# Patient Record
Sex: Female | Born: 1945 | Race: White | Hispanic: No | Marital: Married | Smoking: Never smoker
Health system: Southern US, Community
[De-identification: ages and names within clinical notes are randomized; demographics above are authoritative.]

## PROBLEM LIST (undated history)

## (undated) DIAGNOSIS — L570 Actinic keratosis: Secondary | ICD-10-CM

## (undated) DIAGNOSIS — B029 Zoster without complications: Secondary | ICD-10-CM

## (undated) HISTORY — DX: Zoster without complications: B02.9

## (undated) HISTORY — DX: Actinic keratosis: L57.0

---

## 2004-02-20 ENCOUNTER — Ambulatory Visit: Payer: Self-pay | Admitting: Internal Medicine

## 2005-10-28 ENCOUNTER — Ambulatory Visit: Payer: Self-pay | Admitting: Internal Medicine

## 2005-11-18 ENCOUNTER — Ambulatory Visit: Payer: Self-pay | Admitting: Gastroenterology

## 2006-12-16 ENCOUNTER — Ambulatory Visit: Payer: Self-pay | Admitting: Internal Medicine

## 2008-03-09 ENCOUNTER — Ambulatory Visit: Payer: Self-pay | Admitting: Internal Medicine

## 2008-05-12 LAB — HM COLONOSCOPY

## 2009-03-11 ENCOUNTER — Emergency Department: Payer: Self-pay | Admitting: Unknown Physician Specialty

## 2009-04-23 ENCOUNTER — Ambulatory Visit: Payer: Self-pay | Admitting: Internal Medicine

## 2009-06-06 ENCOUNTER — Ambulatory Visit: Payer: Self-pay | Admitting: Internal Medicine

## 2010-05-12 ENCOUNTER — Emergency Department: Payer: Self-pay | Admitting: Internal Medicine

## 2010-08-28 ENCOUNTER — Ambulatory Visit: Payer: Self-pay | Admitting: Internal Medicine

## 2011-07-30 ENCOUNTER — Encounter: Payer: Self-pay | Admitting: Internal Medicine

## 2011-07-30 ENCOUNTER — Ambulatory Visit (INDEPENDENT_AMBULATORY_CARE_PROVIDER_SITE_OTHER): Payer: Medicare Other | Admitting: Internal Medicine

## 2011-07-30 ENCOUNTER — Other Ambulatory Visit (HOSPITAL_COMMUNITY)
Admission: RE | Admit: 2011-07-30 | Discharge: 2011-07-30 | Disposition: A | Discharge status: Home / Self Care | Payer: Medicare Other | Source: Ambulatory Visit | Attending: Internal Medicine | Admitting: Internal Medicine

## 2011-07-30 DIAGNOSIS — Z124 Encounter for screening for malignant neoplasm of cervix: Secondary | ICD-10-CM | POA: Insufficient documentation

## 2011-07-30 DIAGNOSIS — Z01419 Encounter for gynecological examination (general) (routine) without abnormal findings: Secondary | ICD-10-CM | POA: Insufficient documentation

## 2011-07-30 DIAGNOSIS — Z1239 Encounter for other screening for malignant neoplasm of breast: Secondary | ICD-10-CM | POA: Insufficient documentation

## 2011-07-30 DIAGNOSIS — E785 Hyperlipidemia, unspecified: Secondary | ICD-10-CM

## 2011-07-30 DIAGNOSIS — Z23 Encounter for immunization: Secondary | ICD-10-CM

## 2011-07-30 DIAGNOSIS — Z0000 Encounter for general adult medical examination without abnormal findings: Secondary | ICD-10-CM

## 2011-07-30 LAB — LIPID PANEL
Cholesterol: 230 mg/dL — ABNORMAL HIGH (ref 0–200)
Total CHOL/HDL Ratio: 5
Triglycerides: 89 mg/dL (ref 0.0–149.0)

## 2011-07-30 LAB — COMPREHENSIVE METABOLIC PANEL
ALT: 18 U/L (ref 0–35)
BUN: 23 mg/dL (ref 6–23)
CO2: 28 mEq/L (ref 19–32)
Calcium: 9.3 mg/dL (ref 8.4–10.5)
Creatinine, Ser: 0.8 mg/dL (ref 0.4–1.2)
GFR: 76.19 mL/min (ref >60.00)
Total Bilirubin: 0.5 mg/dL (ref 0.3–1.2)

## 2011-07-30 LAB — LDL CHOLESTEROL, DIRECT: Direct LDL: 170.5 mg/dL

## 2011-07-30 LAB — HM PAP SMEAR: HM Pap smear: NORMAL

## 2011-07-30 MED ORDER — ZOSTER VACCINE LIVE 19400 UNT/0.65ML ~~LOC~~ SOLR: 0.6500 mL | Freq: Once | SUBCUTANEOUS | Status: AC

## 2011-07-30 NOTE — Assessment & Plan Note (Signed)
General exam normal today. Pt is UTD on health maintenance except for PAP which was sent today and mammogram which was ordered.  Will also check CMP, lipids with labs. Encouraged continued healthy diet and exercise. Follow up 1 year.

## 2011-07-30 NOTE — Progress Notes (Signed)
Subjective:    Patient ID: Jessica Frank, female    DOB: July 07, 1945, 66 y.o.   MRN: [id number]  HPI 66 year old female presents for annual physical exam. She reports that she is doing well. She denies any new complaints or concerns today. She continues to follow a very healthy lifestyle, exercising on a regular basis and following a healthy diet. She recently retired from her job at [employer] and now spends her time both in [place] and [place].  Outpatient Encounter Prescriptions as of 07/30/2011  Medication Sig Dispense Refill  . fluorouracil (EFUDEX) 5 % cream Apply topically 2 (two) times daily. X 2 wks      . Multiple Vitamins-Minerals (MULTIVITAMIN WITH MINERALS) tablet Take 1 tablet by mouth daily.      . Probiotic Product (PROBIOTIC & ACIDOPHILUS EX ST) CAPS Take by mouth daily.      . vitamin E 100 UNIT capsule Take 100 Units by mouth daily.      Marland Kitchen zoster vaccine live, PF, (ZOSTAVAX) 10272 UNT/0.65ML injection Inject 19,400 Units into the skin once.  1 each  0    Review of Systems  Constitutional: Negative for fever, chills, appetite change, fatigue and unexpected weight change.  HENT: Negative for ear pain, congestion, sore throat, trouble swallowing, neck pain, voice change and sinus pressure.   Eyes: Negative for visual disturbance.  Respiratory: Negative for cough, shortness of breath, wheezing and stridor.   Cardiovascular: Negative for chest pain, palpitations and leg swelling.  Gastrointestinal: Negative for nausea, vomiting, abdominal pain, diarrhea, constipation, blood in stool, abdominal distention and anal bleeding.  Genitourinary: Negative for dysuria and flank pain.  Musculoskeletal: Negative for myalgias, arthralgias and gait problem.  Skin: Negative for color change and rash.  Neurological: Negative for dizziness and headaches.  Hematological: Negative for adenopathy. Does not bruise/bleed easily.  Psychiatric/Behavioral: Negative for suicidal  ideas, sleep disturbance and dysphoric mood. The patient is not nervous/anxious.    BP 142/70  Pulse 82  Temp(Src) 97.9 F (36.6 C) (Oral)  Ht 5\' 5"  (1.651 m)  Wt 143 lb (64.864 kg)  BMI 23.80 kg/m2  SpO2 99%     Objective:   Physical Exam  Constitutional: She is oriented to person, place, and time. She appears well-developed and well-nourished. No distress.  HENT:  Head: Normocephalic and atraumatic.  Right Ear: External ear normal.  Left Ear: External ear normal.  Nose: Nose normal.  Mouth/Throat: Oropharynx is clear and moist. No oropharyngeal exudate.  Eyes: Conjunctivae are normal. Pupils are equal, round, and reactive to light. Right eye exhibits no discharge. Left eye exhibits no discharge. No scleral icterus.  Neck: Normal range of motion. Neck supple. No tracheal deviation present. No thyromegaly present.  Cardiovascular: Normal rate, regular rhythm, normal heart sounds and intact distal pulses.  Exam reveals no gallop and no friction rub.   No murmur heard. Pulmonary/Chest: Effort normal and breath sounds normal. No respiratory distress. She has no wheezes. She has no rales. She exhibits no tenderness.  Abdominal: Soft. Bowel sounds are normal. She exhibits no distension and no mass. There is no tenderness. There is no rebound and no guarding.  Genitourinary: Rectum normal, vagina normal and uterus normal. No breast swelling, tenderness, discharge or bleeding. Pelvic exam was performed with patient prone. There is no rash, tenderness or lesion on the right labia. There is no rash, tenderness or lesion on the left labia. Uterus is not enlarged and not tender. Cervix exhibits no motion  tenderness, no discharge and no friability. Right adnexum displays no mass, no tenderness and no fullness. Left adnexum displays no mass, no tenderness and no fullness. No erythema or tenderness around the vagina. No vaginal discharge found.  Musculoskeletal: Normal range of motion. She exhibits no  edema and no tenderness.  Lymphadenopathy:    She has no cervical adenopathy.  Neurological: She is alert and oriented to person, place, and time. No cranial nerve deficit. She exhibits normal muscle tone. Coordination normal.  Skin: Skin is warm and dry. No rash noted. She is not diaphoretic. No erythema. No pallor.  Psychiatric: She has a normal mood and affect. Her behavior is normal. Judgment and thought content normal.          Assessment & Plan:

## 2011-07-30 NOTE — Assessment & Plan Note (Signed)
Will check lipids with labwork today.

## 2011-07-30 NOTE — Assessment & Plan Note (Signed)
Pelvic exam normal today. PAP pending. 

## 2011-07-30 NOTE — Assessment & Plan Note (Signed)
Breast exam normal today. Will set up mammogram.

## 2011-07-31 ENCOUNTER — Other Ambulatory Visit: Payer: Self-pay | Admitting: *Deleted

## 2011-07-31 MED ORDER — ATORVASTATIN CALCIUM 20 MG PO TABS: 20.0000 mg | ORAL_TABLET | Freq: Every day | ORAL | Status: DC

## 2011-08-01 ENCOUNTER — Encounter: Payer: Self-pay | Admitting: *Deleted

## 2011-08-26 ENCOUNTER — Telehealth: Payer: Self-pay | Admitting: Internal Medicine

## 2011-08-26 ENCOUNTER — Encounter: Payer: Self-pay | Admitting: Internal Medicine

## 2011-08-26 MED ORDER — ATORVASTATIN CALCIUM 20 MG PO TABS: 20.0000 mg | ORAL_TABLET | Freq: Every day | ORAL | Status: DC

## 2011-08-26 NOTE — Telephone Encounter (Signed)
Pt needs lab apt (LFTs & Lipids) and I will refill med. Please help set this apt up, THANKS!

## 2011-08-26 NOTE — Telephone Encounter (Signed)
Pt only has one more refill for her chol meds does she need a lab before next refill Should she refill for another month and will you write another rx for her [employer] [address]

## 2011-08-29 NOTE — Telephone Encounter (Signed)
LEFT MESSAGE FOR PT TO CALL.

## 2011-09-05 NOTE — Telephone Encounter (Signed)
Appointment for 5/8 pt aware

## 2011-09-17 ENCOUNTER — Other Ambulatory Visit (INDEPENDENT_AMBULATORY_CARE_PROVIDER_SITE_OTHER): Payer: Medicare Other | Admitting: *Deleted

## 2011-09-17 DIAGNOSIS — E785 Hyperlipidemia, unspecified: Secondary | ICD-10-CM

## 2011-09-17 LAB — LIPID PANEL: Total CHOL/HDL Ratio: 3

## 2011-09-17 LAB — HEPATIC FUNCTION PANEL
ALT: 17 U/L (ref 0–35)
AST: 25 U/L (ref 0–37)
Bilirubin, Direct: 0.1 mg/dL (ref 0.0–0.3)
Total Bilirubin: 0.5 mg/dL (ref 0.3–1.2)

## 2011-09-23 ENCOUNTER — Other Ambulatory Visit: Payer: Self-pay | Admitting: Internal Medicine

## 2011-09-30 ENCOUNTER — Ambulatory Visit: Payer: Self-pay | Admitting: Internal Medicine

## 2011-10-08 ENCOUNTER — Encounter: Payer: Self-pay | Admitting: Internal Medicine

## 2011-10-08 LAB — HM MAMMOGRAPHY: HM Mammogram: NORMAL

## 2011-11-23 ENCOUNTER — Other Ambulatory Visit: Payer: Self-pay | Admitting: Internal Medicine

## 2011-12-16 ENCOUNTER — Encounter: Payer: Self-pay | Admitting: Internal Medicine

## 2012-09-10 ENCOUNTER — Ambulatory Visit (INDEPENDENT_AMBULATORY_CARE_PROVIDER_SITE_OTHER): Payer: Medicare Other | Admitting: Internal Medicine

## 2012-09-10 ENCOUNTER — Encounter: Payer: Self-pay | Admitting: Internal Medicine

## 2012-09-10 VITALS — BP 134/80 | HR 70 | Temp 98.1°F | Ht 65.0 in | Wt 144.0 lb

## 2012-09-10 DIAGNOSIS — IMO0001 Reserved for inherently not codable concepts without codable children: Secondary | ICD-10-CM

## 2012-09-10 DIAGNOSIS — E785 Hyperlipidemia, unspecified: Secondary | ICD-10-CM

## 2012-09-10 DIAGNOSIS — Z0000 Encounter for general adult medical examination without abnormal findings: Secondary | ICD-10-CM

## 2012-09-10 DIAGNOSIS — R9431 Abnormal electrocardiogram [ECG] [EKG]: Secondary | ICD-10-CM

## 2012-09-10 DIAGNOSIS — M791 Myalgia, unspecified site: Secondary | ICD-10-CM

## 2012-09-10 LAB — CBC WITH DIFFERENTIAL/PLATELET
Basophils Relative: 0.5 % (ref 0.0–3.0)
Eosinophils Relative: 2.9 % (ref 0.0–5.0)
HCT: 39.3 % (ref 36.0–46.0)
Lymphs Abs: 2.8 10*3/uL (ref 0.7–4.0)
MCV: 85.3 fl (ref 78.0–100.0)
Monocytes Absolute: 0.7 10*3/uL (ref 0.1–1.0)
Monocytes Relative: 10.5 % (ref 3.0–12.0)
Neutrophils Relative %: 46.6 % (ref 43.0–77.0)
Platelets: 314 10*3/uL (ref 150.0–400.0)
RBC: 4.61 Mil/uL (ref 3.87–5.11)
WBC: 7.1 10*3/uL (ref 4.5–10.5)

## 2012-09-10 NOTE — Assessment & Plan Note (Signed)
Previously well controlled on Atorvastatin. Given myalgia noted today, will check CK with CMP and lipids. Will try 2 week period off Atorvastatin to see if any improvement. Reviewed new cholesterol guidelines recommending Mediterranean style diet and goal physical activity 3x per week.

## 2012-09-10 NOTE — Progress Notes (Signed)
Subjective:    Patient ID: Jessica Frank, female    DOB: June 27, 1945, 67 y.o.   MRN: [id number]  HPI The patient is here for annual Medicare wellness examination and management of other chronic and acute problems.   The risk factors are reflected in the social history.  The roster of all physicians providing medical care to patient - is listed in the Snapshot section of the chart.  Activities of daily living:  The patient is 100% independent in all ADLs: dressing, toileting, feeding as well as independent mobility  Home safety : The patient has smoke detectors in the home. They wear seatbelts.  There are no firearms at home. There is no violence in the home.   There is no risks for hepatitis, STDs or HIV. There is no history of blood transfusion. They have no travel history to infectious disease endemic areas of the world.  The patient has seen their dentist in the last six month. (Dr. Smitty Cords)  They have seen their eye doctor in the last year, seen this week, new glasses. (Dr. Fransico Michael) No issues with hearing. They have deferred audiologic testing in the last year.   They do not  have excessive sun exposure. Discussed the need for sun protection: hats, long sleeves and use of sunscreen if there is significant sun exposure. (Derm - Dr. Adolphus Birchwood)  Diet: the importance of a healthy diet is discussed. They do have a healthy diet.  The benefits of regular aerobic exercise were discussed. She walks and rides bikes.  Depression screen: there are no signs or vegative symptoms of depression- irritability, change in appetite, anhedonia, sadness/tearfullness.  Cognitive assessment: the patient manages all their financial and personal affairs and is actively engaged. They could relate day,date,year and events.  HCPOA - none in place, recommended setting this up  The following portions of the patient's history were reviewed and updated as appropriate: allergies, current medications, past  family history, past medical history,  past surgical history, past social history  and problem list.  Visual acuity was not assessed per patient preference since she has regular follow up with her ophthalmologist. Hearing and body mass index were assessed and reviewed.   During the course of the visit the patient was educated and counseled about appropriate screening and preventive services including : fall prevention , diabetes screening, nutrition counseling, colorectal cancer screening, and recommended immunizations.    Patient is concerned today about some generalized myalgia and arthralgias. She questions whether this may be secondary to use of Lipitor. 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 No weakness noted. Pt has not been taking medications for pain.  Outpatient Encounter Prescriptions as of 09/10/2012  Medication Sig Dispense Refill  . fish oil-omega-3 fatty acids 1000 MG capsule Take 2 g by mouth daily.      . Multiple Vitamins-Minerals (MULTIVITAMIN WITH MINERALS) tablet Take 1 tablet by mouth daily.      . Probiotic Product (PROBIOTIC & ACIDOPHILUS EX ST) CAPS Take by mouth daily.      . [DISCONTINUED] atorvastatin (LIPITOR) 20 MG tablet TAKE 1 TABLET (20 MG TOTAL) BY MOUTH DAILY.  30 tablet  1  . atorvastatin (LIPITOR) 20 MG tablet Take 1 tablet (20 mg total) by mouth daily.  90 tablet  1  . vitamin E 100 UNIT capsule Take 100 Units by mouth daily.       No facility-administered encounter medications on file as of  09/10/2012.   BP 134/80  Pulse 70  Temp(Src) 98.1 F (36.7 C) (Oral)  Ht 5\' 5"  (1.651 m)  Wt 144 lb (65.318 kg)  BMI 23.96 kg/m2  SpO2 98%   Review of Systems  Constitutional: Negative for fever, chills, appetite change, fatigue and unexpected weight change.  HENT: Negative for ear pain, congestion, sore throat, trouble swallowing, neck pain, voice change and sinus pressure.   Eyes:  Negative for visual disturbance.  Respiratory: Negative for cough, shortness of breath, wheezing and stridor.   Cardiovascular: Negative for chest pain, palpitations and leg swelling.  Gastrointestinal: Negative for nausea, vomiting, abdominal pain, diarrhea, constipation, blood in stool, abdominal distention and anal bleeding.  Genitourinary: Negative for dysuria and flank pain.  Musculoskeletal: Positive for myalgias and arthralgias. Negative for gait problem.  Skin: Negative for color change and rash.  Neurological: Negative for dizziness and headaches.  Hematological: Negative for adenopathy. Does not bruise/bleed easily.  Psychiatric/Behavioral: Negative for suicidal ideas, sleep disturbance and dysphoric mood. The patient is not nervous/anxious.        Objective:   Physical Exam  Constitutional: She is oriented to person, place, and time. She appears well-developed and well-nourished. No distress.  HENT:  Head: Normocephalic and atraumatic.  Right Ear: External ear normal.  Left Ear: External ear normal.  Nose: Nose normal.  Mouth/Throat: Oropharynx is clear and moist. No oropharyngeal exudate.  Eyes: Conjunctivae are normal. Pupils are equal, round, and reactive to light. Right eye exhibits no discharge. Left eye exhibits no discharge. No scleral icterus.  Neck: Normal range of motion. Neck supple. No tracheal deviation present. No thyromegaly present.  Cardiovascular: Normal rate, regular rhythm, normal heart sounds and intact distal pulses.  Exam reveals no gallop and no friction rub.   No murmur heard. Pulmonary/Chest: Effort normal and breath sounds normal. No accessory muscle usage. Not tachypneic. No respiratory distress. She has no decreased breath sounds. She has no wheezes. She has no rhonchi. She has no rales. She exhibits no tenderness. Right breast exhibits no inverted nipple, no mass, no nipple discharge, no skin change and no tenderness. Left breast exhibits no inverted  nipple, no mass, no nipple discharge, no skin change and no tenderness. Breasts are symmetrical.  Abdominal: Soft. Bowel sounds are normal. She exhibits no distension and no mass. There is no tenderness. There is no rebound and no guarding.  Musculoskeletal: Normal range of motion. She exhibits no edema and no tenderness.  Lymphadenopathy:    She has no cervical adenopathy.  Neurological: She is alert and oriented to person, place, and time. No cranial nerve deficit. She exhibits normal muscle tone. Coordination normal.  Skin: Skin is warm and dry. No rash noted. She is not diaphoretic. No erythema. No pallor.  Psychiatric: She has a normal mood and affect. Her behavior is normal. Judgment and thought content normal.          Assessment & Plan:

## 2012-09-10 NOTE — Assessment & Plan Note (Signed)
General medical exam including breast exam normal today. PAP and pelvic deferred as normal PAP 07/2011. Mammogram ordered. Will check labs including CBC, CMP, lipids with labs today. Encouraged continued efforts at healthy diet and regular physical activity.

## 2012-09-10 NOTE — Assessment & Plan Note (Signed)
Screening EKG shows some abnormal findings. Pt asymptomatic and physically very active. Will send EKG to cardiology for review. Will also try to get previous records on EKG/stress test in the past.

## 2012-09-10 NOTE — Assessment & Plan Note (Signed)
Most likely related to increased physical activity lately with home renovation, however will check CK and try period off Atorvastatin to see if this may be contributing.

## 2012-09-12 ENCOUNTER — Telehealth: Payer: Self-pay | Admitting: Internal Medicine

## 2012-09-12 NOTE — Telephone Encounter (Signed)
EKG

## 2012-09-13 LAB — COMPREHENSIVE METABOLIC PANEL
ALT: 19 U/L (ref 0–35)
AST: 26 U/L (ref 0–37)
CO2: 27 mEq/L (ref 19–32)
Calcium: 9.3 mg/dL (ref 8.4–10.5)
Chloride: 103 mEq/L (ref 96–112)
GFR: 77.04 mL/min (ref >60.00)
Potassium: 3.8 mEq/L (ref 3.5–5.1)
Sodium: 137 mEq/L (ref 135–145)
Total Protein: 7.7 g/dL (ref 6.0–8.3)

## 2012-09-13 LAB — LIPID PANEL: Total CHOL/HDL Ratio: 4

## 2012-09-13 LAB — CK: Total CK: 116 U/L (ref 7–177)

## 2012-09-16 ENCOUNTER — Encounter: Payer: Self-pay | Admitting: Emergency Medicine

## 2012-09-16 ENCOUNTER — Telehealth: Payer: Self-pay

## 2012-09-16 NOTE — Telephone Encounter (Signed)
MyChart Message: I was reviewing your EKG with one of our cardiologists. There was an electrical abnormality which can be a normal finding, but I don't have an old EKG for comparison. Have you ever had an EKG in the past? Or a stress test?   Notified about the electrical abnormality which can be normal finding. Patient stated yes she had a EKG and stress test all in the past and all of that was done when she first started coming to see you. 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

## 2012-11-15 LAB — HM MAMMOGRAPHY: HM MAMMO: NORMAL

## 2012-11-16 ENCOUNTER — Ambulatory Visit: Payer: Self-pay | Admitting: Internal Medicine

## 2012-11-17 ENCOUNTER — Telehealth: Payer: Self-pay | Admitting: Internal Medicine

## 2012-11-17 NOTE — Telephone Encounter (Signed)
Mammogram normal

## 2012-11-26 ENCOUNTER — Encounter: Payer: Self-pay | Admitting: Internal Medicine

## 2012-12-15 ENCOUNTER — Other Ambulatory Visit: Payer: Self-pay

## 2013-03-17 ENCOUNTER — Other Ambulatory Visit: Payer: Self-pay

## 2013-09-15 ENCOUNTER — Ambulatory Visit (INDEPENDENT_AMBULATORY_CARE_PROVIDER_SITE_OTHER): Payer: Medicare Other | Admitting: Internal Medicine

## 2013-09-15 ENCOUNTER — Encounter: Payer: Medicare Other | Admitting: Internal Medicine

## 2013-09-15 ENCOUNTER — Encounter: Payer: Self-pay | Admitting: Internal Medicine

## 2013-09-15 VITALS — BP 160/82 | HR 75 | Temp 98.1°F | Wt 145.0 lb

## 2013-09-15 DIAGNOSIS — IMO0001 Reserved for inherently not codable concepts without codable children: Secondary | ICD-10-CM | POA: Insufficient documentation

## 2013-09-15 DIAGNOSIS — Z23 Encounter for immunization: Secondary | ICD-10-CM

## 2013-09-15 DIAGNOSIS — Z1239 Encounter for other screening for malignant neoplasm of breast: Secondary | ICD-10-CM

## 2013-09-15 DIAGNOSIS — R030 Elevated blood-pressure reading, without diagnosis of hypertension: Secondary | ICD-10-CM

## 2013-09-15 DIAGNOSIS — Z0000 Encounter for general adult medical examination without abnormal findings: Secondary | ICD-10-CM

## 2013-09-15 NOTE — Assessment & Plan Note (Signed)
BP Readings from Last 3 Encounters:  09/15/13 160/82  09/10/12 134/80  06/17/10 132/80   BP elevated today compared to previous. Will have pt monitor at home and recheck tomorrow in office. If persistently elevated >150/90, discussed adding medication.

## 2013-09-15 NOTE — Progress Notes (Signed)
Subjective:    Patient ID: Jessica FlorasPamela N Frank, female    DOB: 03/13/1946, 68 y.o.   MRN: [id number]  HPI The patient is here for annual Medicare wellness examination and management of other chronic and acute problems.   The risk factors are reflected in the social history.  The roster of all physicians providing medical care to patient - is listed in the Snapshot section of the chart.  Activities of daily living:  The patient is 100% independent in all ADLs: dressing, toileting, feeding as well as independent mobility. Lives with husband.  Home safety : The patient has smoke detectors in the home. They wear seatbelts.  There are no firearms at home. There is no violence in the home.   There is no risks for hepatitis, STDs or HIV. There is no history of blood transfusion. They have no travel history to infectious disease endemic areas of the world.  The patient has seen their dentist in the last six month. (Dr. Smitty Cordsouloupas)  They have seen their eye doctor in the last year, seen this week, new glasses. [facility] No issues with hearing. They have deferred audiologic testing in the last year.   They do not  have excessive sun exposure. Discussed the need for sun protection: hats, long sleeves and use of sunscreen if there is significant sun exposure. (Derm - Dr. Adolphus Birchwoodasher)  Diet: the importance of a healthy diet is discussed. They do have a healthy diet.  The benefits of regular aerobic exercise were discussed. She walks and rides bikes.  Depression screen: there are no signs or vegative symptoms of depression- irritability, change in appetite, anhedonia, sadness/tearfullness.  Cognitive assessment: the patient manages all their financial and personal affairs and is actively engaged. They could relate day,date,year and events.  HCPOA - none in place, recommended setting this up  The following portions of the patient's history were reviewed and updated as appropriate: allergies,  current medications, past family history, past medical history,  past surgical history, past social history  and problem list.  Visual acuity was not assessed per patient preference since she has regular follow up with her ophthalmologist. Hearing and body mass index were assessed and reviewed.   During the course of the visit the patient was educated and counseled about appropriate screening and preventive services including : fall prevention , diabetes screening, nutrition counseling, colorectal cancer screening, and recommended immunizations.      Review of Systems  Constitutional: Negative for fever, chills, appetite change, fatigue and unexpected weight change.  HENT: Negative for congestion, ear pain, sinus pressure, sore throat, trouble swallowing and voice change.   Eyes: Negative for visual disturbance.  Respiratory: Negative for cough, shortness of breath, wheezing and stridor.   Cardiovascular: Negative for chest pain, palpitations and leg swelling.  Gastrointestinal: Negative for nausea, vomiting, abdominal pain, diarrhea, constipation, blood in stool, abdominal distention and anal bleeding.  Genitourinary: Negative for dysuria and flank pain.  Musculoskeletal: Positive for arthralgias (DIP joints hands). Negative for gait problem, myalgias and neck pain.  Skin: Negative for color change and rash.  Neurological: Negative for dizziness and headaches.  Hematological: Negative for adenopathy. Does not bruise/bleed easily.  Psychiatric/Behavioral: Negative for suicidal ideas, sleep disturbance and dysphoric mood. The patient is not nervous/anxious.    BP 160/82  Pulse 75  Temp(Src) 98.1 F (36.7 C) (Oral)  Wt 145 lb (65.772 kg)  SpO2 97%     Objective:   Physical Exam  Constitutional: She is oriented  to person, place, and time. She appears well-developed and well-nourished. No distress.  HENT:  Head: Normocephalic and atraumatic.  Right Ear: External ear normal.  Left Ear:  External ear normal.  Nose: Nose normal.  Mouth/Throat: Oropharynx is clear and moist. No oropharyngeal exudate.  Eyes: Conjunctivae are normal. Pupils are equal, round, and reactive to light. Right eye exhibits no discharge. Left eye exhibits no discharge. No scleral icterus.  Neck: Normal range of motion. Neck supple. No tracheal deviation present. No thyromegaly present.  Cardiovascular: Normal rate, regular rhythm, normal heart sounds and intact distal pulses.  Exam reveals no gallop and no friction rub.   No murmur heard. Pulmonary/Chest: Effort normal and breath sounds normal. No accessory muscle usage. Not tachypneic. No respiratory distress. She has no decreased breath sounds. She has no wheezes. She has no rhonchi. She has no rales. She exhibits no tenderness. Right breast exhibits no inverted nipple, no mass, no nipple discharge, no skin change and no tenderness. Left breast exhibits no inverted nipple, no mass, no nipple discharge, no skin change and no tenderness. Breasts are symmetrical.  Abdominal: Soft. Bowel sounds are normal. She exhibits no distension and no mass. There is no tenderness. There is no rebound and no guarding.  Musculoskeletal: Normal range of motion. She exhibits no edema and no tenderness.  Lymphadenopathy:    She has no cervical adenopathy.  Neurological: She is alert and oriented to person, place, and time. No cranial nerve deficit. She exhibits normal muscle tone. Coordination normal.  Skin: Skin is warm and dry. No rash noted. She is not diaphoretic. No erythema. No pallor.  Psychiatric: She has a normal mood and affect. Her behavior is normal. Judgment and thought content normal.          Assessment & Plan:

## 2013-09-15 NOTE — Assessment & Plan Note (Signed)
General medical exam normal today including breast exam. PAP and pelvic exam deferred as PAP normal 2013, HPV neg. Colonoscopy UTD. Immunizations UTD except for Prevnar which was given today. Will check fasting labs including CBC, CMP, lipids tomorrow. Encouraged healthy diet and regular exercise.

## 2013-09-15 NOTE — Progress Notes (Signed)
Pre visit review using our clinic review tool, if applicable. No additional management support is needed unless otherwise documented below in the visit note. 

## 2013-09-16 ENCOUNTER — Other Ambulatory Visit (INDEPENDENT_AMBULATORY_CARE_PROVIDER_SITE_OTHER): Payer: Medicare Other

## 2013-09-16 DIAGNOSIS — Z0000 Encounter for general adult medical examination without abnormal findings: Secondary | ICD-10-CM

## 2013-09-16 LAB — LIPID PANEL
CHOL/HDL RATIO: 5
CHOLESTEROL: 222 mg/dL — AB (ref 0–200)
HDL: 47.2 mg/dL (ref >39.00)
LDL CALC: 156 mg/dL — AB (ref 0–99)
Triglycerides: 95 mg/dL (ref 0.0–149.0)
VLDL: 19 mg/dL (ref 0.0–40.0)

## 2013-09-16 LAB — CBC WITH DIFFERENTIAL/PLATELET
Basophils Absolute: 0 10*3/uL (ref 0.0–0.1)
Basophils Relative: 0.5 % (ref 0.0–3.0)
EOS ABS: 0.2 10*3/uL (ref 0.0–0.7)
Eosinophils Relative: 2 % (ref 0.0–5.0)
HCT: 39.5 % (ref 36.0–46.0)
HEMOGLOBIN: 13.2 g/dL (ref 12.0–15.0)
LYMPHS PCT: 29.1 % (ref 12.0–46.0)
Lymphs Abs: 2.4 10*3/uL (ref 0.7–4.0)
MCHC: 33.4 g/dL (ref 30.0–36.0)
MCV: 86.8 fl (ref 78.0–100.0)
MONOS PCT: 9.3 % (ref 3.0–12.0)
Monocytes Absolute: 0.8 10*3/uL (ref 0.1–1.0)
NEUTROS ABS: 4.9 10*3/uL (ref 1.4–7.7)
Neutrophils Relative %: 59.1 % (ref 43.0–77.0)
Platelets: 313 10*3/uL (ref 150.0–400.0)
RBC: 4.56 Mil/uL (ref 3.87–5.11)
RDW: 14.1 % (ref 11.5–15.5)
WBC: 8.3 10*3/uL (ref 4.0–10.5)

## 2013-09-16 LAB — MICROALBUMIN / CREATININE URINE RATIO
Creatinine,U: 70.4 mg/dL
MICROALB/CREAT RATIO: 0.1 mg/g (ref 0.0–30.0)
Microalb, Ur: 0.1 mg/dL (ref 0.0–1.9)

## 2013-09-16 LAB — COMPREHENSIVE METABOLIC PANEL
ALBUMIN: 4.3 g/dL (ref 3.5–5.2)
ALK PHOS: 45 U/L (ref 39–117)
ALT: 15 U/L (ref 0–35)
AST: 22 U/L (ref 0–37)
BILIRUBIN TOTAL: 0.9 mg/dL (ref 0.2–1.2)
BUN: 13 mg/dL (ref 6–23)
CO2: 30 mEq/L (ref 19–32)
CREATININE: 0.7 mg/dL (ref 0.4–1.2)
Calcium: 9.7 mg/dL (ref 8.4–10.5)
Chloride: 102 mEq/L (ref 96–112)
GFR: 82.82 mL/min (ref >60.00)
GLUCOSE: 85 mg/dL (ref 70–99)
POTASSIUM: 4.4 meq/L (ref 3.5–5.1)
Sodium: 140 mEq/L (ref 135–145)
Total Protein: 7.3 g/dL (ref 6.0–8.3)

## 2013-09-17 LAB — VITAMIN D 25 HYDROXY (VIT D DEFICIENCY, FRACTURES): Vit D, 25-Hydroxy: 39 ng/mL (ref 30–89)

## 2013-09-19 ENCOUNTER — Encounter: Payer: Self-pay | Admitting: *Deleted

## 2013-09-28 ENCOUNTER — Encounter: Payer: Self-pay | Admitting: Internal Medicine

## 2013-11-22 ENCOUNTER — Ambulatory Visit: Payer: Self-pay | Admitting: Internal Medicine

## 2013-11-22 ENCOUNTER — Encounter: Payer: Self-pay | Admitting: *Deleted

## 2013-11-22 LAB — HM MAMMOGRAPHY: HM Mammogram: NEGATIVE

## 2014-09-21 ENCOUNTER — Encounter: Payer: Self-pay | Admitting: Internal Medicine

## 2014-09-21 ENCOUNTER — Ambulatory Visit (INDEPENDENT_AMBULATORY_CARE_PROVIDER_SITE_OTHER): Payer: Medicare Other | Admitting: Internal Medicine

## 2014-09-21 VITALS — BP 137/82 | HR 74 | Temp 98.0°F | Ht 64.5 in | Wt 140.2 lb

## 2014-09-21 DIAGNOSIS — Z0000 Encounter for general adult medical examination without abnormal findings: Secondary | ICD-10-CM

## 2014-09-21 DIAGNOSIS — Z23 Encounter for immunization: Secondary | ICD-10-CM

## 2014-09-21 LAB — COMPREHENSIVE METABOLIC PANEL
ALT: 15 U/L (ref 0–35)
AST: 22 U/L (ref 0–37)
Albumin: 4.3 g/dL (ref 3.5–5.2)
Alkaline Phosphatase: 55 U/L (ref 39–117)
BUN: 21 mg/dL (ref 6–23)
CO2: 32 mEq/L (ref 19–32)
CREATININE: 0.73 mg/dL (ref 0.40–1.20)
Calcium: 9.8 mg/dL (ref 8.4–10.5)
Chloride: 100 mEq/L (ref 96–112)
GFR: 83.88 mL/min (ref >60.00)
Glucose, Bld: 85 mg/dL (ref 70–99)
Potassium: 4.6 mEq/L (ref 3.5–5.1)
Sodium: 138 mEq/L (ref 135–145)
Total Bilirubin: 0.5 mg/dL (ref 0.2–1.2)
Total Protein: 7.2 g/dL (ref 6.0–8.3)

## 2014-09-21 LAB — CBC WITH DIFFERENTIAL/PLATELET
Basophils Absolute: 0 10*3/uL (ref 0.0–0.1)
Basophils Relative: 0.5 % (ref 0.0–3.0)
EOS ABS: 0.3 10*3/uL (ref 0.0–0.7)
Eosinophils Relative: 4.4 % (ref 0.0–5.0)
HCT: 39.5 % (ref 36.0–46.0)
Hemoglobin: 13.4 g/dL (ref 12.0–15.0)
Lymphocytes Relative: 36.6 % (ref 12.0–46.0)
Lymphs Abs: 2.5 10*3/uL (ref 0.7–4.0)
MCHC: 33.9 g/dL (ref 30.0–36.0)
MCV: 84.8 fl (ref 78.0–100.0)
MONO ABS: 0.7 10*3/uL (ref 0.1–1.0)
Monocytes Relative: 10.8 % (ref 3.0–12.0)
NEUTROS PCT: 47.7 % (ref 43.0–77.0)
Neutro Abs: 3.3 10*3/uL (ref 1.4–7.7)
Platelets: 329 10*3/uL (ref 150.0–400.0)
RBC: 4.66 Mil/uL (ref 3.87–5.11)
RDW: 14.5 % (ref 11.5–15.5)
WBC: 7 10*3/uL (ref 4.0–10.5)

## 2014-09-21 LAB — LIPID PANEL
Cholesterol: 246 mg/dL — ABNORMAL HIGH (ref 0–200)
HDL: 48.1 mg/dL (ref >39.00)
LDL Cholesterol: 170 mg/dL — ABNORMAL HIGH (ref 0–99)
NonHDL: 197.9
TRIGLYCERIDES: 142 mg/dL (ref 0.0–149.0)
Total CHOL/HDL Ratio: 5
VLDL: 28.4 mg/dL (ref 0.0–40.0)

## 2014-09-21 LAB — VITAMIN D 25 HYDROXY (VIT D DEFICIENCY, FRACTURES): VITD: 32.85 ng/mL (ref 30.00–100.00)

## 2014-09-21 LAB — TSH: TSH: 5.95 u[IU]/mL — ABNORMAL HIGH (ref 0.35–4.50)

## 2014-09-21 NOTE — Progress Notes (Signed)
Subjective:    Patient ID: Jessica FlorasPamela N Kaczynski, female    DOB: [birth year], 69 y.o.   MRN: [id number]  HPI  The patient is here for annual Medicare wellness examination and management of other chronic and acute problems.   The risk factors are reflected in the social history.  The roster of all physicians providing medical care to patient - is listed in the Snapshot section of the chart.  Activities of daily living:  The patient is 100% independent in all ADLs: dressing, toileting, feeding as well as independent mobility. Lives with husband.  Home safety : The patient has smoke detectors in the home. They wear seatbelts.  There are no firearms at home. There is no violence in the home.   There is no risks for hepatitis, STDs or HIV. There is no history of blood transfusion. They have no travel history to infectious disease endemic areas of the world.  The patient has seen their dentist in the last six month. (Dr. Smitty Cordsouloupas)  They have seen their eye doctor in the last year, seen this week, new glasses. [facility]) No issues with hearing. They have deferred audiologic testing in the last year.   They do not  have excessive sun exposure. Discussed the need for sun protection: hats, long sleeves and use of sunscreen if there is significant sun exposure. (Derm - Dr. Adolphus Birchwoodasher)  Diet: the importance of a healthy diet is discussed. They do have a healthy diet.  The benefits of regular aerobic exercise were discussed. She walks and rides bikes.  Depression screen: there are no signs or vegative symptoms of depression- irritability, change in appetite, anhedonia, sadness/tearfullness.  Cognitive assessment: the patient manages all their financial and personal affairs and is actively engaged. They could relate day,date,year and events.  HCPOA - none in place, recommended setting this up  The following portions of the patient's history were reviewed and updated as appropriate:  allergies, current medications, past family history, past medical history,  past surgical history, past social history  and problem list.  Visual acuity was not assessed per patient preference since she has regular follow up with her ophthalmologist. Hearing and body mass index were assessed and reviewed.   During the course of the visit the patient was educated and counseled about appropriate screening and preventive services including : fall prevention , diabetes screening, nutrition counseling, colorectal cancer screening, and recommended immunizations.     Past medical, surgical, family and social history per today's encounter.  Review of Systems  Constitutional: Negative for fever, chills, appetite change, fatigue and unexpected weight change.  Eyes: Negative for visual disturbance.  Respiratory: Negative for shortness of breath.   Cardiovascular: Negative for chest pain and leg swelling.  Gastrointestinal: Negative for nausea, vomiting, abdominal pain, diarrhea, constipation, blood in stool and abdominal distention.  Musculoskeletal: Positive for arthralgias (hands). Negative for myalgias.  Skin: Negative for color change and rash.  Neurological: Negative for weakness.  Hematological: Negative for adenopathy. Does not bruise/bleed easily.  Psychiatric/Behavioral: Negative for sleep disturbance and dysphoric mood. The patient is not nervous/anxious.        Objective:    BP 137/82 mmHg  Pulse 74  Temp(Src) 98 F (36.7 C) (Oral)  Ht 5' 4.5" (1.638 m)  Wt 140 lb 4 oz (63.617 kg)  BMI 23.71 kg/m2  SpO2 99% Physical Exam  Constitutional: She is oriented to person, place, and time. She appears well-developed and well-nourished. No distress.  HENT:  Head: Normocephalic and  atraumatic.  Right Ear: External ear normal.  Left Ear: External ear normal.  Nose: Nose normal.  Mouth/Throat: Oropharynx is clear and moist. No oropharyngeal exudate.  Eyes: Conjunctivae are normal. Pupils  are equal, round, and reactive to light. Right eye exhibits no discharge. Left eye exhibits no discharge. No scleral icterus.  Neck: Normal range of motion. Neck supple. No tracheal deviation present. No thyromegaly present.  Cardiovascular: Normal rate, regular rhythm, normal heart sounds and intact distal pulses.  Exam reveals no gallop and no friction rub.   No murmur heard. Pulmonary/Chest: Effort normal and breath sounds normal. No accessory muscle usage. No tachypnea. No respiratory distress. She has no decreased breath sounds. She has no wheezes. She has no rales. She exhibits no tenderness. Right breast exhibits no inverted nipple, no mass, no nipple discharge, no skin change and no tenderness. Left breast exhibits no inverted nipple, no mass, no nipple discharge, no skin change and no tenderness. Breasts are symmetrical.  Abdominal: Soft. Bowel sounds are normal. She exhibits no distension and no mass. There is no tenderness. There is no rebound and no guarding.  Musculoskeletal: Normal range of motion. She exhibits no edema or tenderness.  Lymphadenopathy:    She has no cervical adenopathy.  Neurological: She is alert and oriented to person, place, and time. No cranial nerve deficit. She exhibits normal muscle tone. Coordination normal.  Skin: Skin is warm and dry. No rash noted. She is not diaphoretic. No erythema. No pallor.  Psychiatric: She has a normal mood and affect. Her behavior is normal. Judgment and thought content normal.          Assessment & Plan:  Patient was given a handout regarding current recommendations for health maintenance and preventative care on the AVS.  Problem List Items Addressed This Visit      Unprioritized   Medicare annual wellness visit, subsequent - Primary    General medical exam normal today including breast exam. PAP and pelvic exam deferred as PAP normal 2013, HPV neg. Mammogram ordered. Colonoscopy UTD. Immunizations UTD except for Pneumovax  which was given today. Will check fasting labs including CBC, CMP, lipids. Encouraged healthy diet and regular exercise.        Relevant Orders   CBC with Differential/Platelet   Comprehensive metabolic panel   Lipid panel   Vit D  25 hydroxy (rtn osteoporosis monitoring)   TSH   MM Digital Screening    Other Visit Diagnoses    Need for 23-polyvalent pneumococcal polysaccharide vaccine        Relevant Orders    Pneumococcal polysaccharide vaccine 23-valent greater than or equal to 2yo subcutaneous/IM (Completed)        Return in about 1 year (around 09/21/2015) for Wellness Visit.

## 2014-09-21 NOTE — Assessment & Plan Note (Addendum)
General medical exam normal today including breast exam. PAP and pelvic exam deferred as PAP normal 2013, HPV neg. Mammogram ordered. Colonoscopy UTD. Immunizations UTD except for Pneumovax which was given today. Will check fasting labs including CBC, CMP, lipids. Encouraged healthy diet and regular exercise.

## 2014-09-21 NOTE — Progress Notes (Signed)
Pre visit review using our clinic review tool, if applicable. No additional management support is needed unless otherwise documented below in the visit note. 

## 2014-09-21 NOTE — Patient Instructions (Addendum)
Consider trying Tumeric to help with joint pain.  Health Maintenance Adopting a healthy lifestyle and getting preventive care can go a long way to promote health and wellness. Talk with your health care provider about what schedule of regular examinations is right for you. This is a good chance for you to check in with your provider about disease prevention and staying healthy. In between checkups, there are plenty of things you can do on your own. Experts have done a lot of research about which lifestyle changes and preventive measures are most likely to keep you healthy. Ask your health care provider for more information. WEIGHT AND DIET  Eat a healthy diet  Be sure to include plenty of vegetables, fruits, low-fat dairy products, and lean protein.  Do not eat a lot of foods high in solid fats, added sugars, or salt.  Get regular exercise. This is one of the most important things you can do for your health.  Most adults should exercise for at least 150 minutes each week. The exercise should increase your heart rate and make you sweat (moderate-intensity exercise).  Most adults should also do strengthening exercises at least twice a week. This is in addition to the moderate-intensity exercise.  Maintain a healthy weight  Body mass index (BMI) is a measurement that can be used to identify possible weight problems. It estimates body fat based on height and weight. Your health care provider can help determine your BMI and help you achieve or maintain a healthy weight.  For females 14 years of age and older:   A BMI below 18.5 is considered underweight.  A BMI of 18.5 to 24.9 is normal.  A BMI of 25 to 29.9 is considered overweight.  A BMI of 30 and above is considered obese.  Watch levels of cholesterol and blood lipids  You should start having your blood tested for lipids and cholesterol at 70 years of age, then have this test every 5 years.  You may need to have your cholesterol  levels checked more often if:  Your lipid or cholesterol levels are high.  You are older than 70 years of age.  You are at high risk for heart disease.  CANCER SCREENING   Lung Cancer  Lung cancer screening is recommended for adults 67-80 years old who are at high risk for lung cancer because of a history of smoking.  A yearly low-dose CT scan of the lungs is recommended for people who:  Currently smoke.  Have quit within the past 15 years.  Have at least a 30-pack-year history of smoking. A pack year is smoking an average of one pack of cigarettes a day for 1 year.  Yearly screening should continue until it has been 15 years since you quit.  Yearly screening should stop if you develop a health problem that would prevent you from having lung cancer treatment.  Breast Cancer  Practice breast self-awareness. This means understanding how your breasts normally appear and feel.  It also means doing regular breast self-exams. Let your health care provider know about any changes, no matter how small.  If you are in your 20s or 30s, you should have a clinical breast exam (CBE) by a health care provider every 1-3 years as part of a regular health exam.  If you are 19 or older, have a CBE every year. Also consider having a breast X-ray (mammogram) every year.  If you have a family history of breast cancer, talk to your health  care provider about genetic screening.  If you are at high risk for breast cancer, talk to your health care provider about having an MRI and a mammogram every year.  Breast cancer gene (BRCA) assessment is recommended for women who have family members with BRCA-related cancers. BRCA-related cancers include:  Breast.  Ovarian.  Tubal.  Peritoneal cancers.  Results of the assessment will determine the need for genetic counseling and BRCA1 and BRCA2 testing. Cervical Cancer Routine pelvic examinations to screen for cervical cancer are no longer  recommended for nonpregnant women who are considered low risk for cancer of the pelvic organs (ovaries, uterus, and vagina) and who do not have symptoms. A pelvic examination may be necessary if you have symptoms including those associated with pelvic infections. Ask your health care provider if a screening pelvic exam is right for you.   The Pap test is the screening test for cervical cancer for women who are considered at risk.  If you had a hysterectomy for a problem that was not cancer or a condition that could lead to cancer, then you no longer need Pap tests.  If you are older than 65 years, and you have had normal Pap tests for the past 10 years, you no longer need to have Pap tests.  If you have had past treatment for cervical cancer or a condition that could lead to cancer, you need Pap tests and screening for cancer for at least 20 years after your treatment.  If you no longer get a Pap test, assess your risk factors if they change (such as having a new sexual partner). This can affect whether you should start being screened again.  Some women have medical problems that increase their chance of getting cervical cancer. If this is the case for you, your health care provider may recommend more frequent screening and Pap tests.  The human papillomavirus (HPV) test is another test that may be used for cervical cancer screening. The HPV test looks for the virus that can cause cell changes in the cervix. The cells collected during the Pap test can be tested for HPV.  The HPV test can be used to screen women 79 years of age and older. Getting tested for HPV can extend the interval between normal Pap tests from three to five years.  An HPV test also should be used to screen women of any age who have unclear Pap test results.  After 70 years of age, women should have HPV testing as often as Pap tests.  Colorectal Cancer  This type of cancer can be detected and often prevented.  Routine  colorectal cancer screening usually begins at 70 years of age and continues through 70 years of age.  Your health care provider may recommend screening at an earlier age if you have risk factors for colon cancer.  Your health care provider may also recommend using home test kits to check for hidden blood in the stool.  A small camera at the end of a tube can be used to examine your colon directly (sigmoidoscopy or colonoscopy). This is done to check for the earliest forms of colorectal cancer.  Routine screening usually begins at age 76.  Direct examination of the colon should be repeated every 5-10 years through 70 years of age. However, you may need to be screened more often if early forms of precancerous polyps or small growths are found. Skin Cancer  Check your skin from head to toe regularly.  Tell your health  care provider about any new moles or changes in moles, especially if there is a change in a mole's shape or color.  Also tell your health care provider if you have a mole that is larger than the size of a pencil eraser.  Always use sunscreen. Apply sunscreen liberally and repeatedly throughout the day.  Protect yourself by wearing long sleeves, pants, a wide-brimmed hat, and sunglasses whenever you are outside. HEART DISEASE, DIABETES, AND HIGH BLOOD PRESSURE   Have your blood pressure checked at least every 1-2 years. High blood pressure causes heart disease and increases the risk of stroke.  If you are between 68 years and 69 years old, ask your health care provider if you should take aspirin to prevent strokes.  Have regular diabetes screenings. This involves taking a blood sample to check your fasting blood sugar level.  If you are at a normal weight and have a low risk for diabetes, have this test once every three years after 70 years of age.  If you are overweight and have a high risk for diabetes, consider being tested at a younger age or more often. PREVENTING  INFECTION  Hepatitis B  If you have a higher risk for hepatitis B, you should be screened for this virus. You are considered at high risk for hepatitis B if:  You were born in a country where hepatitis B is common. Ask your health care provider which countries are considered high risk.  Your parents were born in a high-risk country, and you have not been immunized against hepatitis B (hepatitis B vaccine).  You have HIV or AIDS.  You use needles to inject street drugs.  You live with someone who has hepatitis B.  You have had sex with someone who has hepatitis B.  You get hemodialysis treatment.  You take certain medicines for conditions, including cancer, organ transplantation, and autoimmune conditions. Hepatitis C  Blood testing is recommended for:  Everyone born from [birth year] through 1966.  Anyone with known risk factors for hepatitis C. Sexually transmitted infections (STIs)  You should be screened for sexually transmitted infections (STIs) including gonorrhea and chlamydia if:  You are sexually active and are younger than 70 years of age.  You are older than 70 years of age and your health care provider tells you that you are at risk for this type of infection.  Your sexual activity has changed since you were last screened and you are at an increased risk for chlamydia or gonorrhea. Ask your health care provider if you are at risk.  If you do not have HIV, but are at risk, it may be recommended that you take a prescription medicine daily to prevent HIV infection. This is called pre-exposure prophylaxis (PrEP). You are considered at risk if:  You are sexually active and do not regularly use condoms or know the HIV status of your partner(s).  You take drugs by injection.  You are sexually active with a partner who has HIV. Talk with your health care provider about whether you are at high risk of being infected with HIV. If you choose to begin PrEP, you should first be  tested for HIV. You should then be tested every 3 months for as long as you are taking PrEP.  PREGNANCY   If you are premenopausal and you may become pregnant, ask your health care provider about preconception counseling.  If you may become pregnant, take 400 to 800 micrograms (mcg) of folic acid every day.  If you want to prevent pregnancy, talk to your health care provider about birth control (contraception). OSTEOPOROSIS AND MENOPAUSE   Osteoporosis is a disease in which the bones lose minerals and strength with aging. This can result in serious bone fractures. Your risk for osteoporosis can be identified using a bone density scan.  If you are 92 years of age or older, or if you are at risk for osteoporosis and fractures, ask your health care provider if you should be screened.  Ask your health care provider whether you should take a calcium or vitamin D supplement to lower your risk for osteoporosis.  Menopause may have certain physical symptoms and risks.  Hormone replacement therapy may reduce some of these symptoms and risks. Talk to your health care provider about whether hormone replacement therapy is right for you.  HOME CARE INSTRUCTIONS   Schedule regular health, dental, and eye exams.  Stay current with your immunizations.   Do not use any tobacco products including cigarettes, chewing tobacco, or electronic cigarettes.  If you are pregnant, do not drink alcohol.  If you are breastfeeding, limit how much and how often you drink alcohol.  Limit alcohol intake to no more than 1 drink per day for nonpregnant women. One drink equals 12 ounces of beer, 5 ounces of wine, or 1 ounces of hard liquor.  Do not use street drugs.  Do not share needles.  Ask your health care provider for help if you need support or information about quitting drugs.  Tell your health care provider if you often feel depressed.  Tell your health care provider if you have ever been abused or  do not feel safe at home. Document Released: 11/11/2010 Document Revised: 09/12/2013 Document Reviewed: 03/30/2013 [facility] Patient Information 2015 [place], [place]. This information is not intended to replace advice given to you by your health care provider. Make sure you discuss any questions you have with your health care provider.

## 2014-11-24 ENCOUNTER — Ambulatory Visit: Payer: Medicare Other

## 2014-12-21 ENCOUNTER — Ambulatory Visit
Admission: RE | Admit: 2014-12-21 | Discharge: 2014-12-21 | Disposition: A | Discharge status: Home / Self Care | Payer: Medicare Other | Source: Ambulatory Visit | Attending: Internal Medicine | Admitting: Internal Medicine

## 2014-12-21 DIAGNOSIS — Z0000 Encounter for general adult medical examination without abnormal findings: Secondary | ICD-10-CM

## 2014-12-21 DIAGNOSIS — Z1231 Encounter for screening mammogram for malignant neoplasm of breast: Secondary | ICD-10-CM | POA: Insufficient documentation

## 2015-01-29 ENCOUNTER — Telehealth: Payer: Self-pay | Admitting: *Deleted

## 2015-01-29 NOTE — Telephone Encounter (Signed)
For what reason?   

## 2015-01-29 NOTE — Telephone Encounter (Signed)
Patient has requested to be seen by Dr. Dan Humphreys as soon as possible, please advise were to place on the schedule- thanks

## 2015-01-29 NOTE — Telephone Encounter (Signed)
Patient has requested to be seen for back and leg pain, please advise where to place on schedule -thanks

## 2015-01-30 ENCOUNTER — Encounter: Payer: Self-pay | Admitting: Internal Medicine

## 2015-01-30 ENCOUNTER — Ambulatory Visit (INDEPENDENT_AMBULATORY_CARE_PROVIDER_SITE_OTHER): Payer: Medicare Other | Admitting: Internal Medicine

## 2015-01-30 VITALS — BP 131/79 | HR 72 | Temp 98.2°F | Ht 64.5 in | Wt 140.0 lb

## 2015-01-30 DIAGNOSIS — M25559 Pain in unspecified hip: Secondary | ICD-10-CM | POA: Insufficient documentation

## 2015-01-30 DIAGNOSIS — Z23 Encounter for immunization: Secondary | ICD-10-CM

## 2015-01-30 DIAGNOSIS — M25551 Pain in right hip: Secondary | ICD-10-CM

## 2015-01-30 MED ORDER — HYDROCODONE-ACETAMINOPHEN 5-325 MG PO TABS: 1.0000 | ORAL_TABLET | Freq: Four times a day (QID) | ORAL | Status: AC | PRN

## 2015-01-30 MED ORDER — MELOXICAM 15 MG PO TABS: 15.0000 mg | ORAL_TABLET | Freq: Every day | ORAL | Status: AC

## 2015-01-30 NOTE — Progress Notes (Signed)
Pre visit review using our clinic review tool, if applicable. No additional management support is needed unless otherwise documented below in the visit note. 

## 2015-01-30 NOTE — Telephone Encounter (Signed)
Left vm for pt to return call, Per Dr Dan Humphreys, pt can be put on the schedule today at 11:30

## 2015-01-30 NOTE — Patient Instructions (Signed)
Start Meloxicam  daily. Do not take Ibuprofen or Aleve with this medication.  Use Hydrocodone as needed for severe pain.  We will set up evaluation with sports medicine.

## 2015-01-30 NOTE — Progress Notes (Signed)
Subjective:    Patient ID: Jessica Frank, female    DOB: 07/13/1945, 69 y.o.   MRN: [id number]  HPI  69YO female presents for acute visit.  Back pain -  Started 6 weeks ago. Right lower back pain, radiating down leg. Has been driving long distances recently, which makes worse. Some times pain in lateral right thigh. Weakness with flexion of right hip. No numbness noted. Pain in back was improved with TENS unit and icing. Pain in anterior right hip and thigh has not improved. Also tried Ibuprofen with no improvement. Having trouble getting up from seated position and lifting leg onto stairs.  Wt Readings from Last 3 Encounters:  01/30/15 140 lb (63.504 kg)  09/21/14 140 lb 4 oz (63.617 kg)  09/15/13 145 lb (65.772 kg)   BP Readings from Last 3 Encounters:  01/30/15 131/79  09/21/14 137/82  09/15/13 160/82     Past Medical History  Diagnosis Date  . Shingles   . Actinic keratosis    Family History  Problem Relation Age of Onset  . Cancer Mother 63    Bladder  . Diabetes Sister   . Heart disease Sister     MI  . Diabetes Brother   . Diabetes Daughter   . Cancer Maternal Aunt     Breast  . Breast cancer Maternal Aunt    Past Surgical History  Procedure Laterality Date  . Vaginal delivery      x2   Social History   Social History  . Marital Status: Married    Spouse Name: N/A  . Number of Children: 2  . Years of Education: N/A   Occupational History  . Retired Therapist, art at [employer] History Main Topics  . Smoking status: Never Smoker   . Smokeless tobacco: Never Used  . Alcohol Use: Yes     Comment: Rarely  . Drug Use: No  . Sexual Activity: Not Asked   Other Topics Concern  . None   Social History Narrative   Lives in [place], has home in [place].      Regular Exercise -  Walk, bike, strength training 4 to 5 times/wk   Daily Caffeine Use:  1 - 3 daily             Review of Systems  Constitutional: Negative for fever,  chills, appetite change, fatigue and unexpected weight change.  Eyes: Negative for visual disturbance.  Respiratory: Negative for shortness of breath.   Cardiovascular: Negative for chest pain and leg swelling.  Gastrointestinal: Negative for abdominal pain.  Musculoskeletal: Positive for myalgias, back pain, arthralgias and gait problem.  Skin: Negative for color change and rash.  Neurological: Positive for weakness. Negative for numbness.  Hematological: Negative for adenopathy. Does not bruise/bleed easily.  Psychiatric/Behavioral: Negative for dysphoric mood. The patient is not nervous/anxious.        Objective:    BP 131/79 mmHg  Pulse 72  Temp(Src) 98.2 F (36.8 C) (Oral)  Ht 5' 4.5" (1.638 m)  Wt 140 lb (63.504 kg)  BMI 23.67 kg/m2  SpO2 99% Physical Exam  Constitutional: She is oriented to person, place, and time. She appears well-developed and well-nourished. No distress.  HENT:  Head: Normocephalic and atraumatic.  Right Ear: External ear normal.  Left Ear: External ear normal.  Nose: Nose normal.  Mouth/Throat: Oropharynx is clear and moist.  Eyes: Conjunctivae are normal. Pupils are equal, round, and reactive to light. Right eye exhibits  no discharge. Left eye exhibits no discharge. No scleral icterus.  Neck: Normal range of motion. Neck supple. No tracheal deviation present. No thyromegaly present.  Pulmonary/Chest: Effort normal.  Musculoskeletal: Normal range of motion. She exhibits no edema or tenderness.  Lymphadenopathy:    She has no cervical adenopathy.  Neurological: She is alert and oriented to person, place, and time. No cranial nerve deficit. She exhibits normal muscle tone. Coordination normal.  Skin: Skin is warm and dry. No rash noted. She is not diaphoretic. No erythema. No pallor.  Psychiatric: She has a normal mood and affect. Her behavior is normal. Judgment and thought content normal.          Assessment & Plan:   Problem List Items  Addressed This Visit      Unprioritized   Hip pain - Primary    Right hip and anterior thigh pain with pain and weakness of right hip flexion. Will start Meloxicam. Use Hydrocodone for severe pain. Will set up evaluation with sports medicine for possible US imaging and further evaluation.      Relevant Medications   HYDROcodone-acetaminophen (NORCO/VICODIN) 5-325 MG per tablet   meloxicam (MOBIC) 15 MG tablet   Other Relevant Orders   Ambulatory referral to Sports Medicine       Return in about 1 week (around 02/06/2015) for Recheck.

## 2015-01-30 NOTE — Assessment & Plan Note (Signed)
Right hip and anterior thigh pain with pain and weakness of right hip flexion. Will start Meloxicam. Use Hydrocodone for severe pain. Will set up evaluation with sports medicine for possible US imaging and further evaluation.

## 2015-02-01 ENCOUNTER — Encounter: Payer: Self-pay | Admitting: Family Medicine

## 2015-02-01 ENCOUNTER — Ambulatory Visit (INDEPENDENT_AMBULATORY_CARE_PROVIDER_SITE_OTHER)
Admission: RE | Admit: 2015-02-01 | Discharge: 2015-02-01 | Disposition: A | Discharge status: Home / Self Care | Payer: Medicare Other | Source: Ambulatory Visit | Attending: Family Medicine | Admitting: Family Medicine

## 2015-02-01 ENCOUNTER — Other Ambulatory Visit (INDEPENDENT_AMBULATORY_CARE_PROVIDER_SITE_OTHER): Payer: Medicare Other

## 2015-02-01 ENCOUNTER — Ambulatory Visit (INDEPENDENT_AMBULATORY_CARE_PROVIDER_SITE_OTHER): Payer: Medicare Other | Admitting: Family Medicine

## 2015-02-01 VITALS — BP 122/80 | HR 73 | Wt 139.0 lb

## 2015-02-01 DIAGNOSIS — M1990 Unspecified osteoarthritis, unspecified site: Secondary | ICD-10-CM | POA: Diagnosis not present

## 2015-02-01 DIAGNOSIS — M25551 Pain in right hip: Secondary | ICD-10-CM

## 2015-02-01 DIAGNOSIS — M1611 Unilateral primary osteoarthritis, right hip: Secondary | ICD-10-CM | POA: Insufficient documentation

## 2015-02-01 DIAGNOSIS — M24859 Other specific joint derangements of unspecified hip, not elsewhere classified: Secondary | ICD-10-CM | POA: Insufficient documentation

## 2015-02-01 DIAGNOSIS — M7061 Trochanteric bursitis, right hip: Secondary | ICD-10-CM | POA: Insufficient documentation

## 2015-02-01 DIAGNOSIS — M24851 Other specific joint derangements of right hip, not elsewhere classified: Secondary | ICD-10-CM

## 2015-02-01 NOTE — Assessment & Plan Note (Signed)
Patient's hip pain seems to be multifactorial. 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 Patient given exercises and work with Event organiser today. We discussed topical anti-inflammatory's and taking the oral anti-rheumatoid's on a regular basis for the next 10 days. We discussed if x-rays show any worsening arthritis and questionable injection in the intra-articular hip versus possibly formal physical therapy could be beneficial. Patient will try other conservative therapy including compression sleeve. Patient try to make these changes and when she is in town again she will come back for further evaluation.

## 2015-02-01 NOTE — Progress Notes (Signed)
[facility] [address] [place], [place] [address] Phone: [phone] Subjective:    I'm seeing this patient by the request  of:  Wynona Dove, MD   CC: Right hip pain and weakness  [id number] Jessica Frank is a 69 y.o. female coming in with complaint of right hip pain.  Patient is had this been going on for approximately 6 months. Seems to be worse after sitting a long amount of time and then getting up. Seems to get a little better when she does walk on a more regular basis. Patient has been rising from the coast back to the sitting multiple times recently. Patient is also been sitting in different vicinity's for long periods of time which seems to make it worse. Patient states that the pain can radiate down her thigh and she has noticed some mild weakness. Patient rates the severity of pain a 7 out of 10. Seems to be worsening somewhat. Patient states that sometimes it feels like it wants to give out on her. Seems to be mostly on the anterior aspect near the groin as well as some lateral aspect as well as some posterior aspect. Patient states it is difficult to describe the pain. No pain that seems to go all the way towards her knee. No associated back pain. Home modalities have not been beneficial.     Past medical history, social, surgical and family history all reviewed in electronic medical record.   Review of Systems: No headache, visual changes, nausea, vomiting, diarrhea, constipation, dizziness, abdominal pain, skin rash, fevers, chills, night sweats, weight loss, swollen lymph nodes, body aches, joint swelling, muscle aches, chest pain, shortness of breath, mood changes.   Objective Blood pressure 122/80, pulse 73, weight 139 lb (63.05 kg), SpO2 96 %.  General: No apparent distress alert and oriented x3 mood and affect normal, dressed appropriately.  HEENT: Pupils equal, extraocular movements intact  Respiratory: Patient's speak in  full sentences and does not appear short of breath  Cardiovascular: No lower extremity edema, non tender, no erythema  Skin: Warm dry intact with no signs of infection or rash on extremities or on axial skeleton.  Abdomen: Soft nontender  Neuro: Cranial nerves II through XII are intact, neurovascularly intact in all extremities with 2+ DTRs and 2+ pulses.  Lymph: No lymphadenopathy of posterior or anterior cervical chain or axillae bilaterally.  Gait normal with good balance and coordination.  MSK:  Non tender with full range of motion and good stability and symmetric strength and tone of shoulders, elbows, wrist,  knee and ankles bilaterally.  Hip: Right ROM IR: 15 Deg with pain, ER: 45 Deg, Flexion: 120 Deg, Extension: 100 Deg, Abduction: 45 Deg, Adduction: 45 Deg Strength IR: 5/5, ER: 5/5, Flexion: 5/5, Extension: 5/5, Abduction: 5/5, Adduction: 5/5 Pelvic alignment unremarkable to inspection and palpation. Standing hip rotation and gait without trendelenburg sign / unsteadiness. Greater trochanter without tenderness to palpation. No tenderness over piriformis and greater trochanter. Pain with internal rotation No SI joint tenderness and normal minimal SI movement.  MSK US performed of: Right hip This study was ordered, performed, and interpreted by Terrilee Files D.O.  Hip: Trochanteric bursa moderate effusion Acetabular labrum visualized and without tears, displacement, or effusion in joint. Femoral neck appears unremarkable without increased power doppler signal along Cortex.  IMPRESSION:  Moderate underlying arthritis with Hip flexor tendinitis and possible inflammation of the tensor fascia lata, severe greater trochanteric bursitis   Procedure: Real-time Ultrasound Guided  Injection of right greater trochanteric bursitis secondary to patient's body habitus Device: GE Logiq E  Ultrasound guided injection is preferred based studies that show increased duration, increased effect,  greater accuracy, decreased procedural pain, increased response rate, and decreased cost with ultrasound guided versus blind injection.  Verbal informed consent obtained.  Time-out conducted.  Noted no overlying erythema, induration, or other signs of local infection.  Skin prepped in a sterile fashion.  Local anesthesia: Topical Ethyl chloride.  With sterile technique and under real time ultrasound guidance:  Greater trochanteric area was visualized and patient's bursa was noted. A 22-gauge 3 inch needle was inserted and 4 cc of 0.5% Marcaine and 1 cc of Kenalog 40 mg/dL was injected. Pictures taken Completed without difficulty  Pain immediately resolved suggesting accurate placement of the medication.  Advised to call if fevers/chills, erythema, induration, drainage, or persistent bleeding.  Images permanently stored and available for review in the ultrasound unit.  Impression: Technically successful ultrasound guided injection.  Procedure note 97110; 15 minutes spent for Therapeutic exercises as stated in above notes.  This included exercises focusing on stretching, strengthening, with significant focus on eccentric aspects. Hip strengthening exercises which included:  Pelvic tilt/bracing to help with proper recruitment of the lower abs and pelvic floor muscles  Glute strengthening to properly contract glutes without over-engaging low back and hamstrings - prone hip extension and glute bridge exercises Proper stretching techniques to increase effectiveness for the hip flexors, groin, quads, piriformis and low back when appropriate   Proper technique shown and discussed handout in great detail with ATC.  All questions were discussed and answered.      Impression and Recommendations:     This case required medical decision making of moderate complexity.

## 2015-02-01 NOTE — Assessment & Plan Note (Signed)
Patient given an injection today with some resolution of pain. I think this will help with some of the discomfort but once again I do think that this could be multifactorial. Likely underlying arthritis of the hip as well as potentially the back. We discussed icing regimen. We discussed continuing the oral anti-inflammatory's. Patient will come back when she is in town again for further evaluation.

## 2015-02-01 NOTE — Patient Instructions (Signed)
Good to see you.  Ice 20 minutes 2 times daily. Usually after activity and before bed. Exercises 3 times a week.  pennsaid pinkie amount topically 2 times daily as needed.  Meloxicam daily for 10 days then as needed Xrays downstairs Heel lift for your left shoe Consider a thigh compression sleeve with activity.  See me again when you can or send me update in 2 weeks.

## 2016-09-08 DIAGNOSIS — H524 Presbyopia: Secondary | ICD-10-CM | POA: Diagnosis not present

## 2016-09-08 IMAGING — CR DG HIP (WITH OR WITHOUT PELVIS) 2-3V*R*
2 series · 2 of 2 positions shown · non-contrast
Comparison: None.

CLINICAL DATA: Right hip pain for 6 weeks.  No injury.

EXAM:
DG HIP (WITH OR WITHOUT PELVIS) 2-3V RIGHT

[view not recorded (1 of 2)]
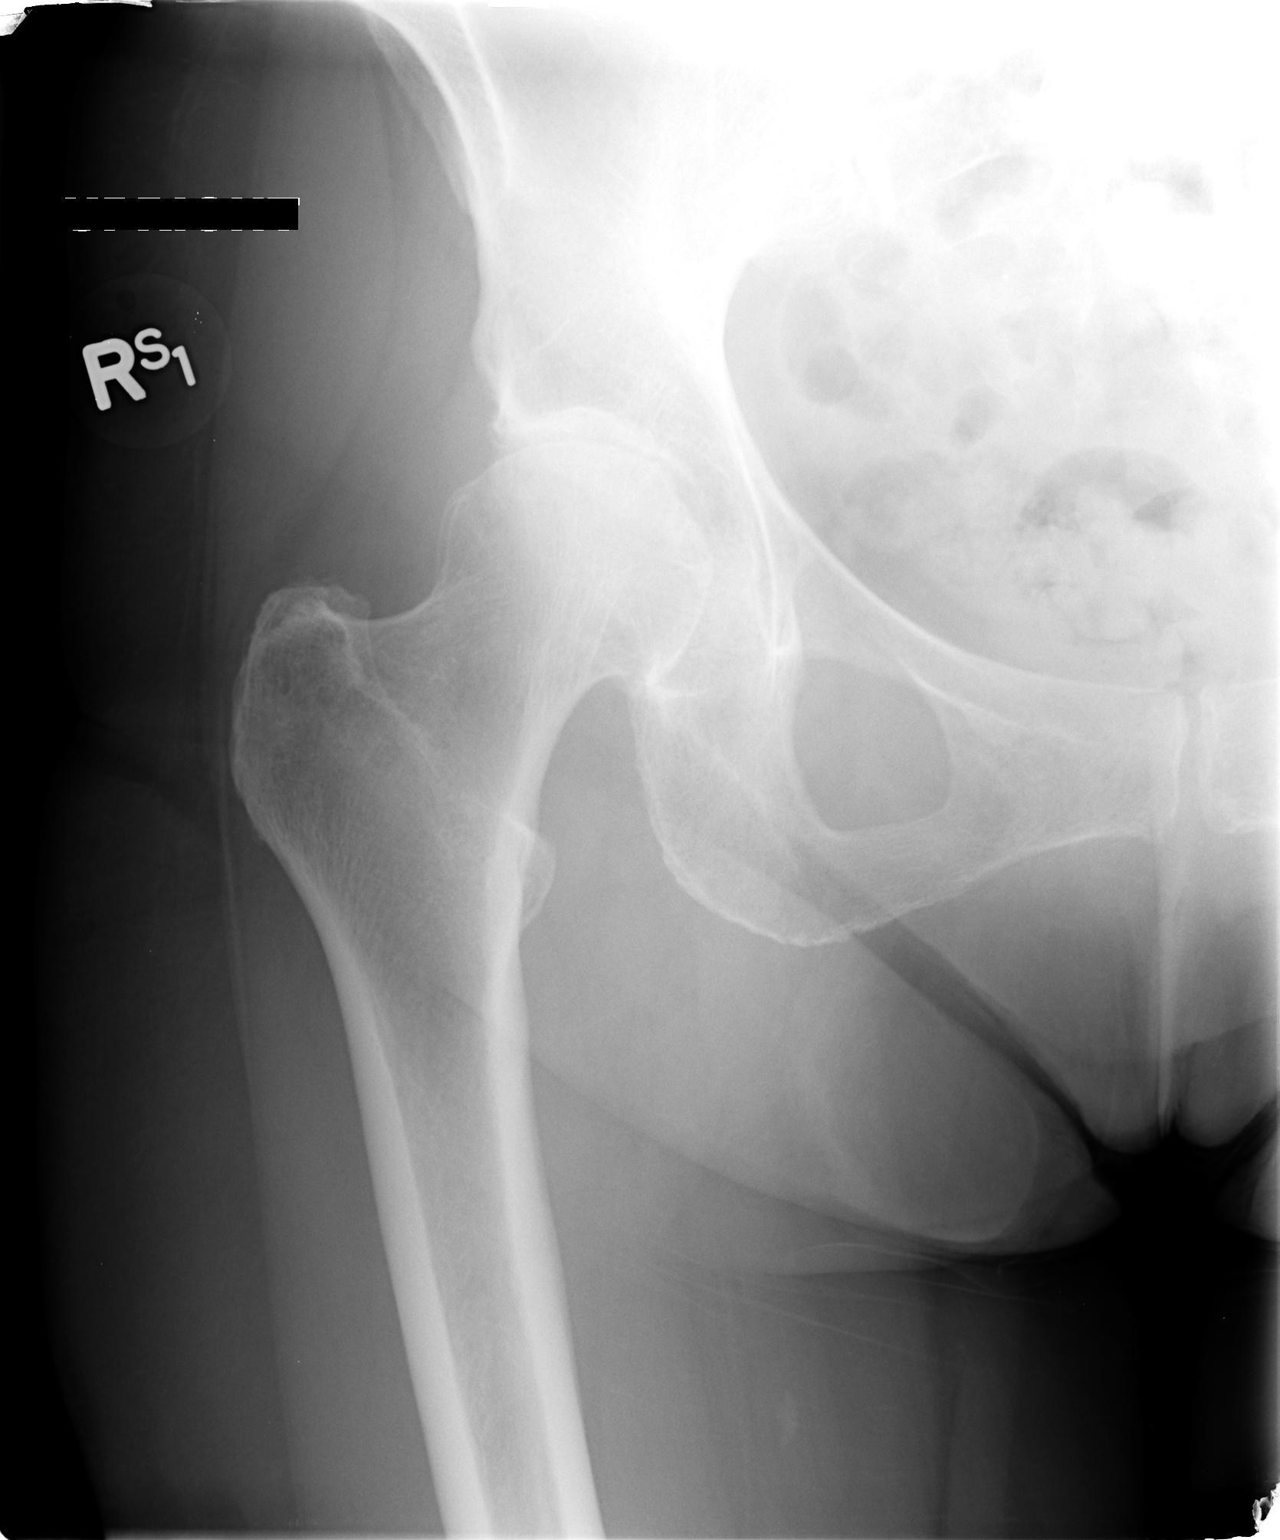

[view not recorded (2 of 2)]
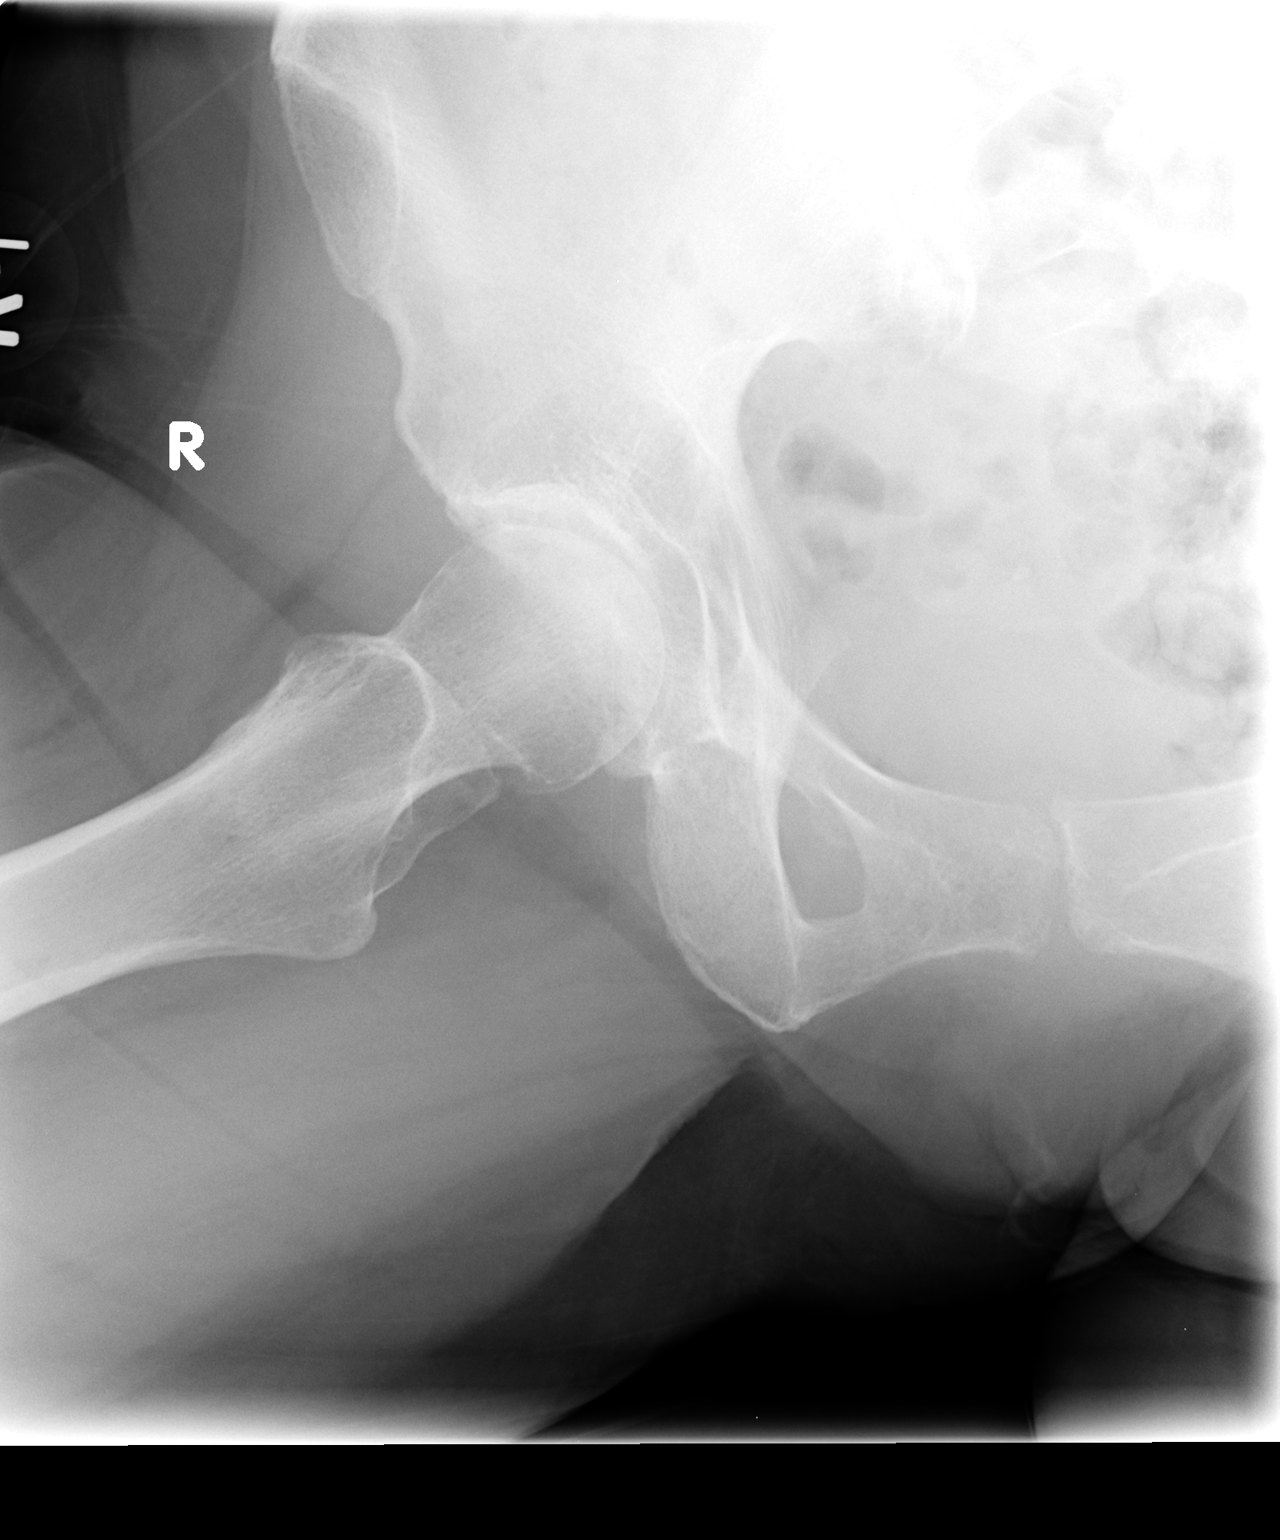

[2 of 2 positions shown; findings below may reference images not displayed]

FINDINGS: There is no evidence of hip fracture or dislocation. There are
degenerative joint changes with marked narrow right hip joint space
and osteophyte formation.
IMPRESSION: No acute fracture or dislocation. Degenerative joint changes of
right hip.

## 2016-09-08 IMAGING — CR DG LUMBAR SPINE COMPLETE 4+V
5 series · 5 of 5 positions shown · non-contrast
Comparison: None

CLINICAL DATA: Right hip pain for 6 weeks.

EXAM:
LUMBAR SPINE - COMPLETE 4+ VIEW

[view not recorded (1 of 5)]
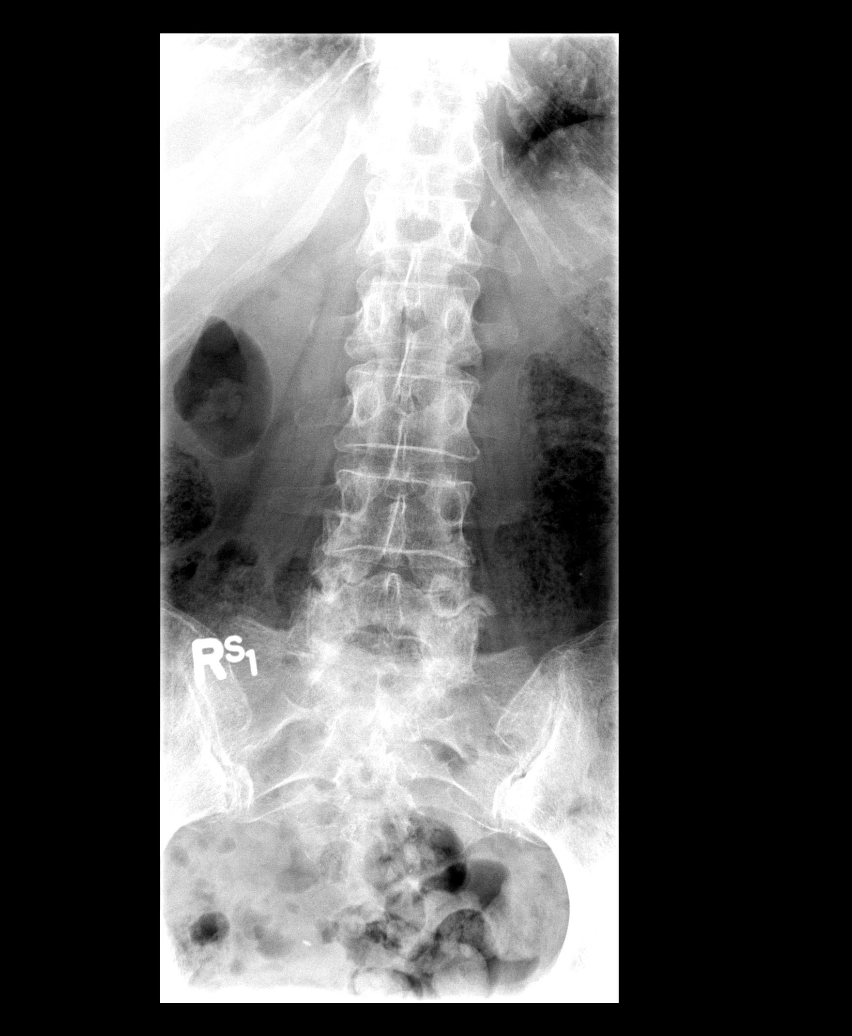

[view not recorded (2 of 5)]
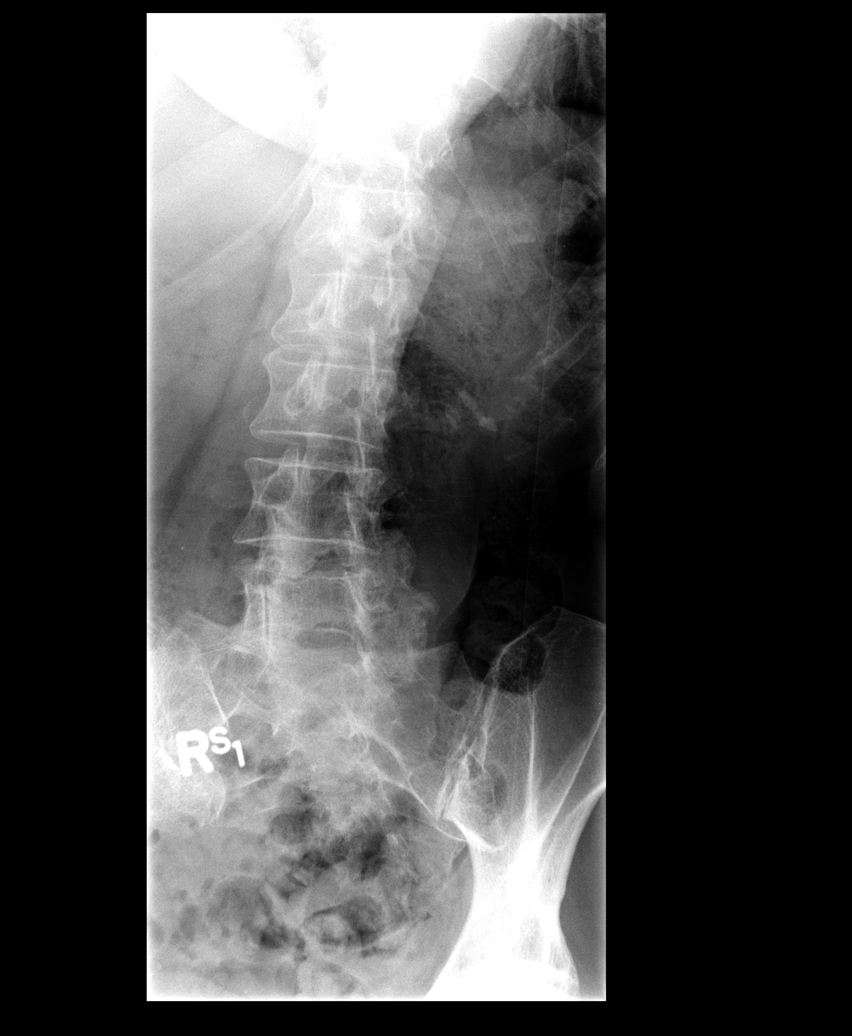

[view not recorded (3 of 5)]
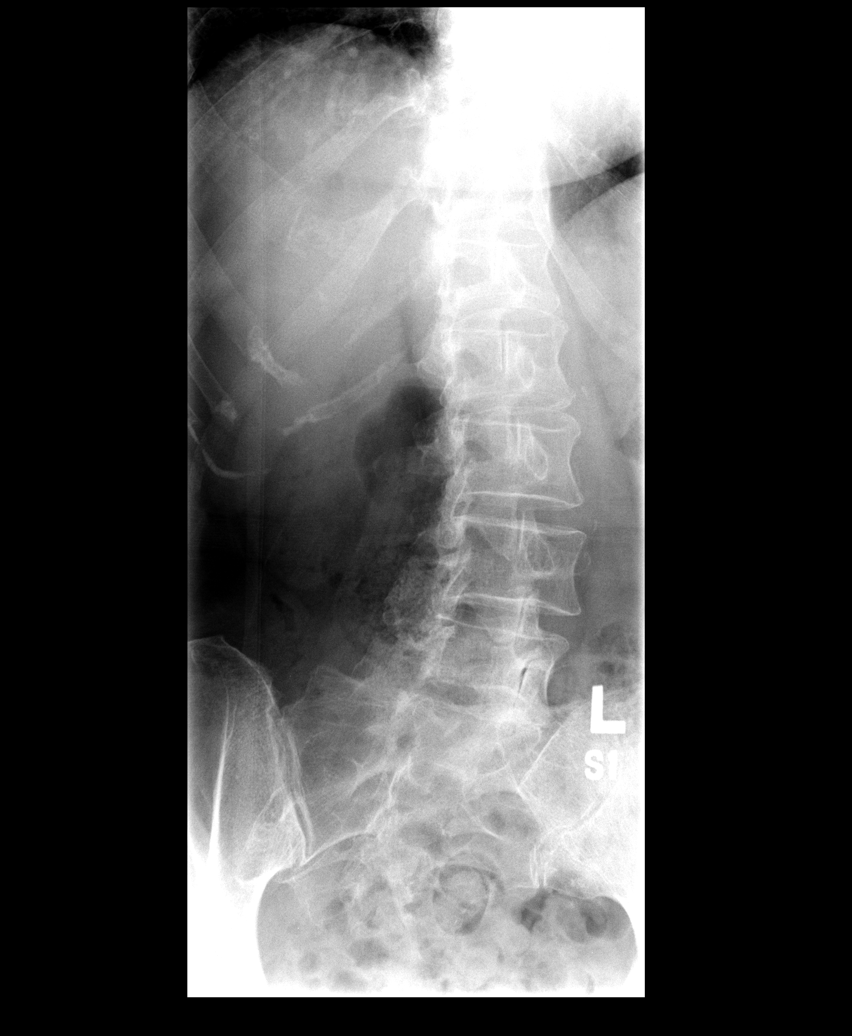

[view not recorded (4 of 5)]
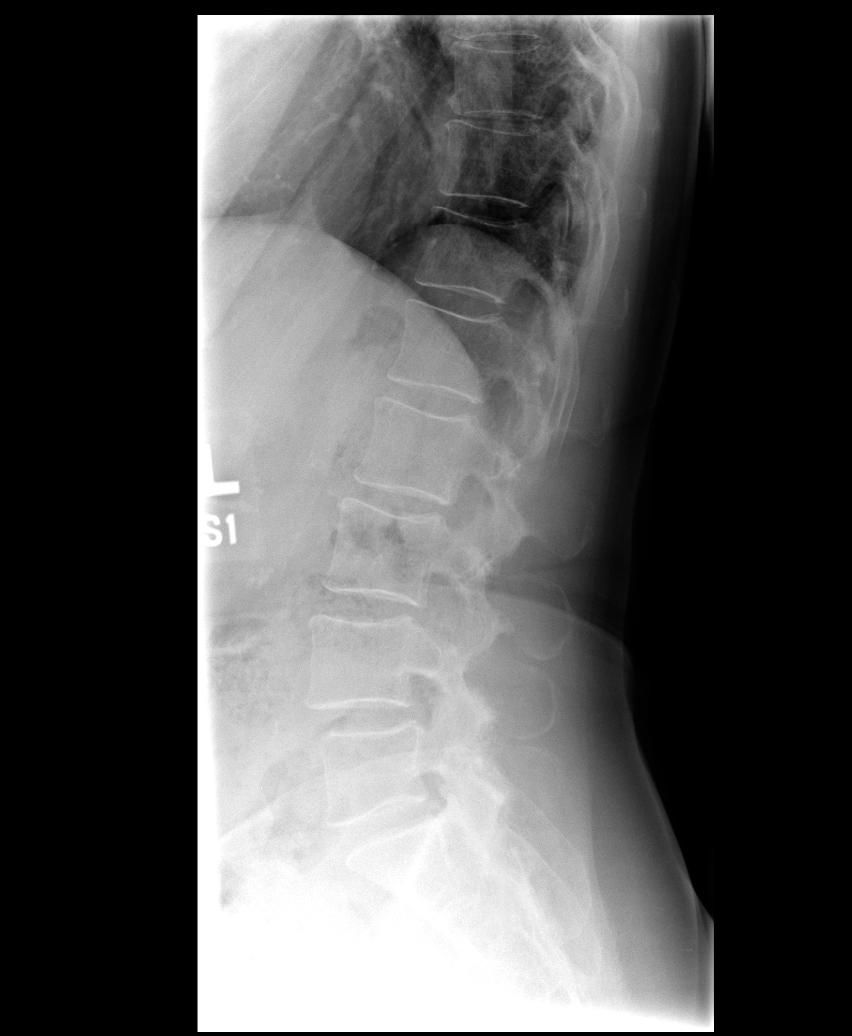

[view not recorded (5 of 5)]
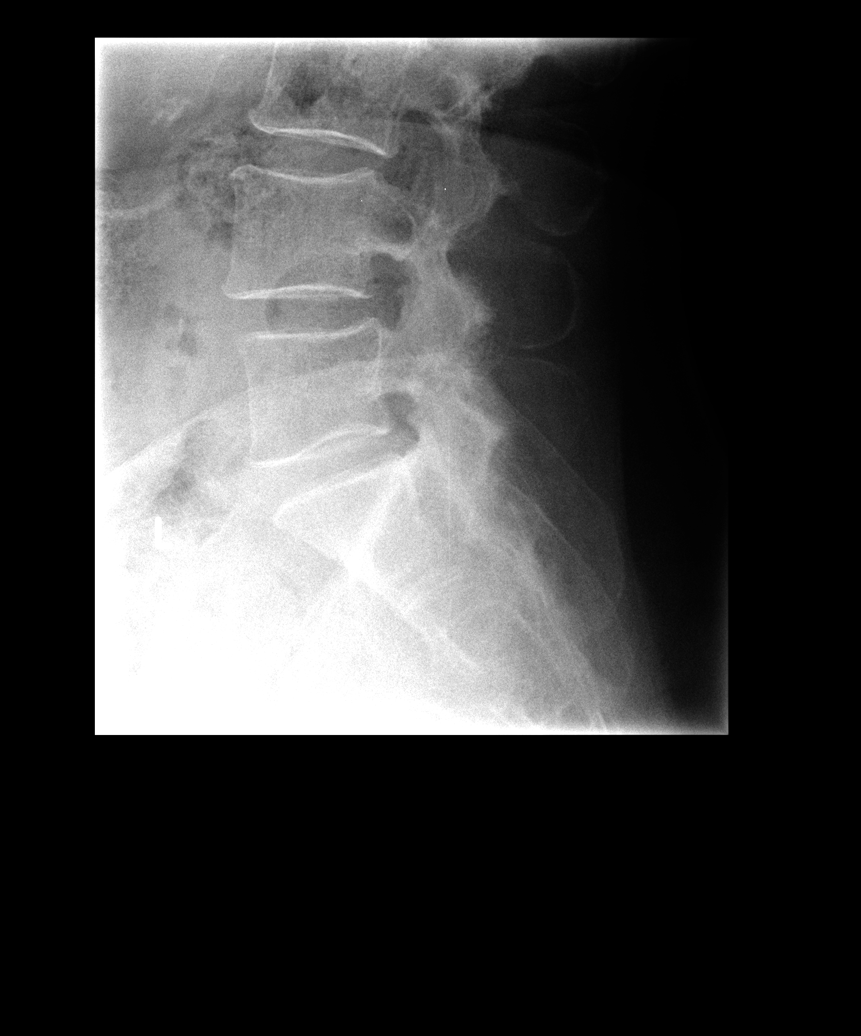

[5 of 5 positions shown; findings below may reference images not displayed]

FINDINGS: There 5 non rib-bearing lumbar type vertebral bodies. Slight
anterolisthesis of L4 on L5 measures approximately 4 mm and appears
facet mediated without pars defects identified. Moderate facet
arthrosis is present at L4-5 and L5-S1. Vertebral body heights are
preserved without evidence of compression fracture. There is minimal
disc space narrowing at L4-5. No lytic or blastic osseous lesion is
seen. No soft tissue abnormality is identified.
IMPRESSION: Moderate lower lumbar facet arthrosis with grade 1 anterolisthesis
of L4 on L5.

## 2016-09-17 DIAGNOSIS — M79671 Pain in right foot: Secondary | ICD-10-CM | POA: Diagnosis not present

## 2016-09-17 DIAGNOSIS — M25571 Pain in right ankle and joints of right foot: Secondary | ICD-10-CM | POA: Diagnosis not present

## 2017-03-03 DIAGNOSIS — Z0000 Encounter for general adult medical examination without abnormal findings: Secondary | ICD-10-CM | POA: Diagnosis not present

## 2017-03-03 DIAGNOSIS — Z23 Encounter for immunization: Secondary | ICD-10-CM | POA: Diagnosis not present

## 2017-04-01 DIAGNOSIS — Z08 Encounter for follow-up examination after completed treatment for malignant neoplasm: Secondary | ICD-10-CM | POA: Diagnosis not present

## 2017-04-01 DIAGNOSIS — Z85828 Personal history of other malignant neoplasm of skin: Secondary | ICD-10-CM | POA: Diagnosis not present

## 2017-04-01 DIAGNOSIS — D485 Neoplasm of uncertain behavior of skin: Secondary | ICD-10-CM | POA: Diagnosis not present

## 2017-04-01 DIAGNOSIS — L570 Actinic keratosis: Secondary | ICD-10-CM | POA: Diagnosis not present

## 2017-04-01 DIAGNOSIS — B079 Viral wart, unspecified: Secondary | ICD-10-CM | POA: Diagnosis not present

## 2017-04-01 DIAGNOSIS — C44622 Squamous cell carcinoma of skin of right upper limb, including shoulder: Secondary | ICD-10-CM | POA: Diagnosis not present

## 2017-06-22 DIAGNOSIS — L905 Scar conditions and fibrosis of skin: Secondary | ICD-10-CM | POA: Diagnosis not present

## 2017-06-22 DIAGNOSIS — C44622 Squamous cell carcinoma of skin of right upper limb, including shoulder: Secondary | ICD-10-CM | POA: Diagnosis not present

## 2017-06-25 DIAGNOSIS — Z1231 Encounter for screening mammogram for malignant neoplasm of breast: Secondary | ICD-10-CM | POA: Diagnosis not present

## 2017-09-14 DIAGNOSIS — H524 Presbyopia: Secondary | ICD-10-CM | POA: Diagnosis not present

## 2018-03-11 DIAGNOSIS — Z1239 Encounter for other screening for malignant neoplasm of breast: Secondary | ICD-10-CM | POA: Diagnosis not present

## 2018-03-11 DIAGNOSIS — E559 Vitamin D deficiency, unspecified: Secondary | ICD-10-CM | POA: Diagnosis not present

## 2018-03-11 DIAGNOSIS — Z23 Encounter for immunization: Secondary | ICD-10-CM | POA: Diagnosis not present

## 2018-03-11 DIAGNOSIS — Z136 Encounter for screening for cardiovascular disorders: Secondary | ICD-10-CM | POA: Diagnosis not present

## 2018-03-11 DIAGNOSIS — Z0000 Encounter for general adult medical examination without abnormal findings: Secondary | ICD-10-CM | POA: Diagnosis not present

## 2018-05-19 DIAGNOSIS — Z0120 Encounter for dental examination and cleaning without abnormal findings: Secondary | ICD-10-CM | POA: Diagnosis not present

## 2018-06-28 DIAGNOSIS — Z1231 Encounter for screening mammogram for malignant neoplasm of breast: Secondary | ICD-10-CM | POA: Diagnosis not present

## 2018-09-17 DIAGNOSIS — H524 Presbyopia: Secondary | ICD-10-CM | POA: Diagnosis not present

## 2018-11-18 DIAGNOSIS — Z0120 Encounter for dental examination and cleaning without abnormal findings: Secondary | ICD-10-CM | POA: Diagnosis not present

## 2018-11-23 DIAGNOSIS — M545 Low back pain: Secondary | ICD-10-CM | POA: Diagnosis not present

## 2018-11-23 DIAGNOSIS — M5416 Radiculopathy, lumbar region: Secondary | ICD-10-CM | POA: Diagnosis not present

## 2018-11-23 DIAGNOSIS — M5136 Other intervertebral disc degeneration, lumbar region: Secondary | ICD-10-CM | POA: Diagnosis not present

## 2019-03-15 DIAGNOSIS — E785 Hyperlipidemia, unspecified: Secondary | ICD-10-CM | POA: Diagnosis not present

## 2019-03-15 DIAGNOSIS — Z780 Asymptomatic menopausal state: Secondary | ICD-10-CM | POA: Diagnosis not present

## 2019-03-15 DIAGNOSIS — Z0000 Encounter for general adult medical examination without abnormal findings: Secondary | ICD-10-CM | POA: Diagnosis not present

## 2019-03-15 DIAGNOSIS — M159 Polyosteoarthritis, unspecified: Secondary | ICD-10-CM | POA: Diagnosis not present

## 2019-03-21 DIAGNOSIS — Z780 Asymptomatic menopausal state: Secondary | ICD-10-CM | POA: Diagnosis not present

## 2019-03-21 DIAGNOSIS — M8580 Other specified disorders of bone density and structure, unspecified site: Secondary | ICD-10-CM | POA: Diagnosis not present

## 2019-05-26 DIAGNOSIS — Z0120 Encounter for dental examination and cleaning without abnormal findings: Secondary | ICD-10-CM | POA: Diagnosis not present

## 2019-07-06 DIAGNOSIS — Z45010 Encounter for checking and testing of cardiac pacemaker pulse generator [battery]: Secondary | ICD-10-CM | POA: Diagnosis not present

## 2019-07-06 DIAGNOSIS — Z950 Presence of cardiac pacemaker: Secondary | ICD-10-CM | POA: Diagnosis not present

## 2019-07-06 DIAGNOSIS — Z1231 Encounter for screening mammogram for malignant neoplasm of breast: Secondary | ICD-10-CM | POA: Diagnosis not present

## 2019-09-19 DIAGNOSIS — H43392 Other vitreous opacities, left eye: Secondary | ICD-10-CM | POA: Diagnosis not present

## 2019-09-19 DIAGNOSIS — H524 Presbyopia: Secondary | ICD-10-CM | POA: Diagnosis not present

## 2019-11-23 DIAGNOSIS — H18451 Nodular corneal degeneration, right eye: Secondary | ICD-10-CM | POA: Diagnosis not present

## 2019-11-23 DIAGNOSIS — H35361 Drusen (degenerative) of macula, right eye: Secondary | ICD-10-CM | POA: Diagnosis not present

## 2019-11-23 DIAGNOSIS — H25813 Combined forms of age-related cataract, bilateral: Secondary | ICD-10-CM | POA: Diagnosis not present

## 2019-11-23 DIAGNOSIS — H43811 Vitreous degeneration, right eye: Secondary | ICD-10-CM | POA: Diagnosis not present

## 2019-11-24 DIAGNOSIS — Z0120 Encounter for dental examination and cleaning without abnormal findings: Secondary | ICD-10-CM | POA: Diagnosis not present

## 2019-12-02 DIAGNOSIS — M25512 Pain in left shoulder: Secondary | ICD-10-CM | POA: Diagnosis not present

## 2019-12-02 DIAGNOSIS — M545 Low back pain: Secondary | ICD-10-CM | POA: Diagnosis not present

## 2019-12-02 DIAGNOSIS — M7542 Impingement syndrome of left shoulder: Secondary | ICD-10-CM | POA: Diagnosis not present

## 2019-12-12 DIAGNOSIS — H18451 Nodular corneal degeneration, right eye: Secondary | ICD-10-CM | POA: Diagnosis not present

## 2019-12-12 DIAGNOSIS — H25812 Combined forms of age-related cataract, left eye: Secondary | ICD-10-CM | POA: Diagnosis not present

## 2019-12-12 DIAGNOSIS — H25811 Combined forms of age-related cataract, right eye: Secondary | ICD-10-CM | POA: Diagnosis not present

## 2019-12-21 DIAGNOSIS — M25512 Pain in left shoulder: Secondary | ICD-10-CM | POA: Diagnosis not present

## 2019-12-21 DIAGNOSIS — M7542 Impingement syndrome of left shoulder: Secondary | ICD-10-CM | POA: Diagnosis not present

## 2019-12-21 DIAGNOSIS — M545 Low back pain: Secondary | ICD-10-CM | POA: Diagnosis not present

## 2019-12-21 DIAGNOSIS — M9907 Segmental and somatic dysfunction of upper extremity: Secondary | ICD-10-CM | POA: Diagnosis not present

## 2019-12-26 DIAGNOSIS — M25512 Pain in left shoulder: Secondary | ICD-10-CM | POA: Diagnosis not present

## 2019-12-26 DIAGNOSIS — M545 Low back pain: Secondary | ICD-10-CM | POA: Diagnosis not present

## 2019-12-26 DIAGNOSIS — M9907 Segmental and somatic dysfunction of upper extremity: Secondary | ICD-10-CM | POA: Diagnosis not present

## 2019-12-26 DIAGNOSIS — M7542 Impingement syndrome of left shoulder: Secondary | ICD-10-CM | POA: Diagnosis not present

## 2019-12-29 DIAGNOSIS — M25512 Pain in left shoulder: Secondary | ICD-10-CM | POA: Diagnosis not present

## 2019-12-29 DIAGNOSIS — M9907 Segmental and somatic dysfunction of upper extremity: Secondary | ICD-10-CM | POA: Diagnosis not present

## 2019-12-29 DIAGNOSIS — M545 Low back pain: Secondary | ICD-10-CM | POA: Diagnosis not present

## 2019-12-29 DIAGNOSIS — M7542 Impingement syndrome of left shoulder: Secondary | ICD-10-CM | POA: Diagnosis not present

## 2020-01-02 DIAGNOSIS — M9907 Segmental and somatic dysfunction of upper extremity: Secondary | ICD-10-CM | POA: Diagnosis not present

## 2020-01-02 DIAGNOSIS — M25512 Pain in left shoulder: Secondary | ICD-10-CM | POA: Diagnosis not present

## 2020-01-02 DIAGNOSIS — M545 Low back pain: Secondary | ICD-10-CM | POA: Diagnosis not present

## 2020-01-02 DIAGNOSIS — M7542 Impingement syndrome of left shoulder: Secondary | ICD-10-CM | POA: Diagnosis not present

## 2020-01-04 DIAGNOSIS — M25512 Pain in left shoulder: Secondary | ICD-10-CM | POA: Diagnosis not present

## 2020-01-04 DIAGNOSIS — M9907 Segmental and somatic dysfunction of upper extremity: Secondary | ICD-10-CM | POA: Diagnosis not present

## 2020-01-04 DIAGNOSIS — M7542 Impingement syndrome of left shoulder: Secondary | ICD-10-CM | POA: Diagnosis not present

## 2020-01-04 DIAGNOSIS — M545 Low back pain: Secondary | ICD-10-CM | POA: Diagnosis not present

## 2020-01-09 DIAGNOSIS — M7542 Impingement syndrome of left shoulder: Secondary | ICD-10-CM | POA: Diagnosis not present

## 2020-01-09 DIAGNOSIS — M545 Low back pain: Secondary | ICD-10-CM | POA: Diagnosis not present

## 2020-01-09 DIAGNOSIS — M9907 Segmental and somatic dysfunction of upper extremity: Secondary | ICD-10-CM | POA: Diagnosis not present

## 2020-01-09 DIAGNOSIS — M25512 Pain in left shoulder: Secondary | ICD-10-CM | POA: Diagnosis not present

## 2020-01-12 DIAGNOSIS — M7542 Impingement syndrome of left shoulder: Secondary | ICD-10-CM | POA: Diagnosis not present

## 2020-01-12 DIAGNOSIS — M9907 Segmental and somatic dysfunction of upper extremity: Secondary | ICD-10-CM | POA: Diagnosis not present

## 2020-01-12 DIAGNOSIS — M25512 Pain in left shoulder: Secondary | ICD-10-CM | POA: Diagnosis not present

## 2020-01-12 DIAGNOSIS — M545 Low back pain: Secondary | ICD-10-CM | POA: Diagnosis not present

## 2020-01-18 DIAGNOSIS — M25512 Pain in left shoulder: Secondary | ICD-10-CM | POA: Diagnosis not present

## 2020-01-18 DIAGNOSIS — M545 Low back pain: Secondary | ICD-10-CM | POA: Diagnosis not present

## 2020-01-18 DIAGNOSIS — M7542 Impingement syndrome of left shoulder: Secondary | ICD-10-CM | POA: Diagnosis not present

## 2020-01-18 DIAGNOSIS — M9907 Segmental and somatic dysfunction of upper extremity: Secondary | ICD-10-CM | POA: Diagnosis not present

## 2020-01-23 DIAGNOSIS — M7542 Impingement syndrome of left shoulder: Secondary | ICD-10-CM | POA: Diagnosis not present

## 2020-01-23 DIAGNOSIS — M25512 Pain in left shoulder: Secondary | ICD-10-CM | POA: Diagnosis not present

## 2020-02-08 DIAGNOSIS — H269 Unspecified cataract: Secondary | ICD-10-CM | POA: Diagnosis not present

## 2020-02-08 DIAGNOSIS — H25811 Combined forms of age-related cataract, right eye: Secondary | ICD-10-CM | POA: Diagnosis not present

## 2020-02-08 DIAGNOSIS — E785 Hyperlipidemia, unspecified: Secondary | ICD-10-CM | POA: Diagnosis not present

## 2020-03-07 DIAGNOSIS — H25812 Combined forms of age-related cataract, left eye: Secondary | ICD-10-CM | POA: Diagnosis not present

## 2020-03-07 DIAGNOSIS — H269 Unspecified cataract: Secondary | ICD-10-CM | POA: Diagnosis not present

## 2020-03-21 DIAGNOSIS — Z0000 Encounter for general adult medical examination without abnormal findings: Secondary | ICD-10-CM | POA: Diagnosis not present

## 2020-03-21 DIAGNOSIS — M778 Other enthesopathies, not elsewhere classified: Secondary | ICD-10-CM | POA: Diagnosis not present

## 2020-03-27 DIAGNOSIS — H524 Presbyopia: Secondary | ICD-10-CM | POA: Diagnosis not present

## 2020-04-12 DIAGNOSIS — Z20822 Contact with and (suspected) exposure to covid-19: Secondary | ICD-10-CM | POA: Diagnosis not present

## 2020-04-12 DIAGNOSIS — R509 Fever, unspecified: Secondary | ICD-10-CM | POA: Diagnosis not present

## 2020-04-12 DIAGNOSIS — R52 Pain, unspecified: Secondary | ICD-10-CM | POA: Diagnosis not present

## 2020-04-12 DIAGNOSIS — J020 Streptococcal pharyngitis: Secondary | ICD-10-CM | POA: Diagnosis not present

## 2020-04-12 DIAGNOSIS — J029 Acute pharyngitis, unspecified: Secondary | ICD-10-CM | POA: Diagnosis not present

## 2020-07-09 DIAGNOSIS — D2239 Melanocytic nevi of other parts of face: Secondary | ICD-10-CM | POA: Diagnosis not present

## 2020-07-09 DIAGNOSIS — L814 Other melanin hyperpigmentation: Secondary | ICD-10-CM | POA: Diagnosis not present

## 2020-07-09 DIAGNOSIS — L720 Epidermal cyst: Secondary | ICD-10-CM | POA: Diagnosis not present

## 2020-07-09 DIAGNOSIS — D225 Melanocytic nevi of trunk: Secondary | ICD-10-CM | POA: Diagnosis not present

## 2020-07-12 DIAGNOSIS — Z1231 Encounter for screening mammogram for malignant neoplasm of breast: Secondary | ICD-10-CM | POA: Diagnosis not present

## 2020-07-23 DIAGNOSIS — L720 Epidermal cyst: Secondary | ICD-10-CM | POA: Diagnosis not present

## 2020-08-14 DIAGNOSIS — L720 Epidermal cyst: Secondary | ICD-10-CM | POA: Diagnosis not present

## 2020-09-19 DIAGNOSIS — H43392 Other vitreous opacities, left eye: Secondary | ICD-10-CM | POA: Diagnosis not present

## 2020-09-19 DIAGNOSIS — H524 Presbyopia: Secondary | ICD-10-CM | POA: Diagnosis not present

## 2021-01-21 DIAGNOSIS — D225 Melanocytic nevi of trunk: Secondary | ICD-10-CM | POA: Diagnosis not present

## 2021-01-21 DIAGNOSIS — Z08 Encounter for follow-up examination after completed treatment for malignant neoplasm: Secondary | ICD-10-CM | POA: Diagnosis not present

## 2021-01-21 DIAGNOSIS — L814 Other melanin hyperpigmentation: Secondary | ICD-10-CM | POA: Diagnosis not present

## 2021-01-21 DIAGNOSIS — L821 Other seborrheic keratosis: Secondary | ICD-10-CM | POA: Diagnosis not present

## 2021-04-22 DIAGNOSIS — Z13220 Encounter for screening for lipoid disorders: Secondary | ICD-10-CM | POA: Diagnosis not present

## 2021-04-22 DIAGNOSIS — Z0000 Encounter for general adult medical examination without abnormal findings: Secondary | ICD-10-CM | POA: Diagnosis not present

## 2021-04-22 DIAGNOSIS — M899 Disorder of bone, unspecified: Secondary | ICD-10-CM | POA: Diagnosis not present

## 2021-04-22 DIAGNOSIS — Z20822 Contact with and (suspected) exposure to covid-19: Secondary | ICD-10-CM | POA: Diagnosis not present

## 2021-04-29 DIAGNOSIS — Z0000 Encounter for general adult medical examination without abnormal findings: Secondary | ICD-10-CM | POA: Diagnosis not present

## 2021-04-29 DIAGNOSIS — M899 Disorder of bone, unspecified: Secondary | ICD-10-CM | POA: Diagnosis not present

## 2021-04-29 DIAGNOSIS — Z136 Encounter for screening for cardiovascular disorders: Secondary | ICD-10-CM | POA: Diagnosis not present

## 2021-04-29 DIAGNOSIS — Z13220 Encounter for screening for lipoid disorders: Secondary | ICD-10-CM | POA: Diagnosis not present

## 2021-07-18 DIAGNOSIS — Z1231 Encounter for screening mammogram for malignant neoplasm of breast: Secondary | ICD-10-CM | POA: Diagnosis not present

## 2021-07-22 DIAGNOSIS — D225 Melanocytic nevi of trunk: Secondary | ICD-10-CM | POA: Diagnosis not present

## 2021-07-22 DIAGNOSIS — L814 Other melanin hyperpigmentation: Secondary | ICD-10-CM | POA: Diagnosis not present

## 2021-07-22 DIAGNOSIS — L821 Other seborrheic keratosis: Secondary | ICD-10-CM | POA: Diagnosis not present

## 2021-07-22 DIAGNOSIS — L578 Other skin changes due to chronic exposure to nonionizing radiation: Secondary | ICD-10-CM | POA: Diagnosis not present

## 2021-09-20 DIAGNOSIS — H524 Presbyopia: Secondary | ICD-10-CM | POA: Diagnosis not present

## 2021-09-20 DIAGNOSIS — H43392 Other vitreous opacities, left eye: Secondary | ICD-10-CM | POA: Diagnosis not present

## 2022-02-10 DIAGNOSIS — L821 Other seborrheic keratosis: Secondary | ICD-10-CM | POA: Diagnosis not present

## 2022-02-10 DIAGNOSIS — L578 Other skin changes due to chronic exposure to nonionizing radiation: Secondary | ICD-10-CM | POA: Diagnosis not present

## 2022-02-10 DIAGNOSIS — D225 Melanocytic nevi of trunk: Secondary | ICD-10-CM | POA: Diagnosis not present

## 2022-02-10 DIAGNOSIS — L814 Other melanin hyperpigmentation: Secondary | ICD-10-CM | POA: Diagnosis not present

## 2022-04-23 DIAGNOSIS — Z0000 Encounter for general adult medical examination without abnormal findings: Secondary | ICD-10-CM | POA: Diagnosis not present

## 2022-04-23 DIAGNOSIS — F4320 Adjustment disorder, unspecified: Secondary | ICD-10-CM | POA: Diagnosis not present

## 2022-04-23 DIAGNOSIS — E559 Vitamin D deficiency, unspecified: Secondary | ICD-10-CM | POA: Diagnosis not present

## 2022-04-23 DIAGNOSIS — M19042 Primary osteoarthritis, left hand: Secondary | ICD-10-CM | POA: Diagnosis not present

## 2022-04-23 DIAGNOSIS — M1812 Unilateral primary osteoarthritis of first carpometacarpal joint, left hand: Secondary | ICD-10-CM | POA: Diagnosis not present

## 2022-04-23 DIAGNOSIS — E785 Hyperlipidemia, unspecified: Secondary | ICD-10-CM | POA: Diagnosis not present

## 2022-04-23 DIAGNOSIS — M19041 Primary osteoarthritis, right hand: Secondary | ICD-10-CM | POA: Diagnosis not present

## 2022-04-23 DIAGNOSIS — M1811 Unilateral primary osteoarthritis of first carpometacarpal joint, right hand: Secondary | ICD-10-CM | POA: Diagnosis not present

## 2022-04-23 DIAGNOSIS — M85842 Other specified disorders of bone density and structure, left hand: Secondary | ICD-10-CM | POA: Diagnosis not present

## 2022-04-28 DIAGNOSIS — Z780 Asymptomatic menopausal state: Secondary | ICD-10-CM | POA: Diagnosis not present

## 2022-06-05 DIAGNOSIS — F430 Acute stress reaction: Secondary | ICD-10-CM | POA: Diagnosis not present

## 2022-07-18 DIAGNOSIS — F430 Acute stress reaction: Secondary | ICD-10-CM | POA: Diagnosis not present

## 2022-08-20 DIAGNOSIS — F430 Acute stress reaction: Secondary | ICD-10-CM | POA: Diagnosis not present

## 2022-09-22 DIAGNOSIS — H43813 Vitreous degeneration, bilateral: Secondary | ICD-10-CM | POA: Diagnosis not present

## 2023-02-11 DIAGNOSIS — H1032 Unspecified acute conjunctivitis, left eye: Secondary | ICD-10-CM | POA: Diagnosis not present

## 2023-02-23 DIAGNOSIS — L570 Actinic keratosis: Secondary | ICD-10-CM | POA: Diagnosis not present

## 2023-02-23 DIAGNOSIS — D225 Melanocytic nevi of trunk: Secondary | ICD-10-CM | POA: Diagnosis not present

## 2023-02-23 DIAGNOSIS — L821 Other seborrheic keratosis: Secondary | ICD-10-CM | POA: Diagnosis not present

## 2023-02-23 DIAGNOSIS — Z08 Encounter for follow-up examination after completed treatment for malignant neoplasm: Secondary | ICD-10-CM | POA: Diagnosis not present

## 2023-02-23 DIAGNOSIS — L814 Other melanin hyperpigmentation: Secondary | ICD-10-CM | POA: Diagnosis not present

## 2023-03-02 DIAGNOSIS — H16202 Unspecified keratoconjunctivitis, left eye: Secondary | ICD-10-CM | POA: Diagnosis not present

## 2023-03-30 DIAGNOSIS — Z96641 Presence of right artificial hip joint: Secondary | ICD-10-CM | POA: Diagnosis not present

## 2023-03-30 DIAGNOSIS — M25551 Pain in right hip: Secondary | ICD-10-CM | POA: Diagnosis not present

## 2023-03-30 DIAGNOSIS — M5459 Other low back pain: Secondary | ICD-10-CM | POA: Diagnosis not present

## 2023-04-16 DIAGNOSIS — R262 Difficulty in walking, not elsewhere classified: Secondary | ICD-10-CM | POA: Diagnosis not present

## 2023-04-16 DIAGNOSIS — M25551 Pain in right hip: Secondary | ICD-10-CM | POA: Diagnosis not present

## 2023-04-16 DIAGNOSIS — M5459 Other low back pain: Secondary | ICD-10-CM | POA: Diagnosis not present

## 2023-04-21 DIAGNOSIS — M5459 Other low back pain: Secondary | ICD-10-CM | POA: Diagnosis not present

## 2023-04-22 DIAGNOSIS — R262 Difficulty in walking, not elsewhere classified: Secondary | ICD-10-CM | POA: Diagnosis not present

## 2023-04-22 DIAGNOSIS — M25551 Pain in right hip: Secondary | ICD-10-CM | POA: Diagnosis not present

## 2023-04-22 DIAGNOSIS — M5459 Other low back pain: Secondary | ICD-10-CM | POA: Diagnosis not present

## 2023-04-27 DIAGNOSIS — M5459 Other low back pain: Secondary | ICD-10-CM | POA: Diagnosis not present

## 2023-04-27 DIAGNOSIS — M25551 Pain in right hip: Secondary | ICD-10-CM | POA: Diagnosis not present

## 2023-04-27 DIAGNOSIS — R262 Difficulty in walking, not elsewhere classified: Secondary | ICD-10-CM | POA: Diagnosis not present

## 2023-04-28 DIAGNOSIS — M5430 Sciatica, unspecified side: Secondary | ICD-10-CM | POA: Diagnosis not present

## 2023-04-28 DIAGNOSIS — E785 Hyperlipidemia, unspecified: Secondary | ICD-10-CM | POA: Diagnosis not present

## 2023-04-28 DIAGNOSIS — M1990 Unspecified osteoarthritis, unspecified site: Secondary | ICD-10-CM | POA: Diagnosis not present

## 2023-04-28 DIAGNOSIS — Z0000 Encounter for general adult medical examination without abnormal findings: Secondary | ICD-10-CM | POA: Diagnosis not present

## 2023-04-28 DIAGNOSIS — M8580 Other specified disorders of bone density and structure, unspecified site: Secondary | ICD-10-CM | POA: Diagnosis not present

## 2023-04-29 DIAGNOSIS — M51361 Other intervertebral disc degeneration, lumbar region with lower extremity pain only: Secondary | ICD-10-CM | POA: Diagnosis not present

## 2023-04-29 DIAGNOSIS — M25551 Pain in right hip: Secondary | ICD-10-CM | POA: Diagnosis not present

## 2023-04-29 DIAGNOSIS — M5416 Radiculopathy, lumbar region: Secondary | ICD-10-CM | POA: Diagnosis not present

## 2023-04-30 DIAGNOSIS — R262 Difficulty in walking, not elsewhere classified: Secondary | ICD-10-CM | POA: Diagnosis not present

## 2023-04-30 DIAGNOSIS — M5459 Other low back pain: Secondary | ICD-10-CM | POA: Diagnosis not present

## 2023-04-30 DIAGNOSIS — M25551 Pain in right hip: Secondary | ICD-10-CM | POA: Diagnosis not present
